# Patient Record
Sex: Male | Born: 1940
Health system: Southern US, Community
[De-identification: ages and names within clinical notes are randomized; demographics above are authoritative.]

## PROBLEM LIST (undated history)

## (undated) DIAGNOSIS — F32A Depression, unspecified: Secondary | ICD-10-CM

## (undated) DIAGNOSIS — M1990 Unspecified osteoarthritis, unspecified site: Secondary | ICD-10-CM

## (undated) DIAGNOSIS — F329 Major depressive disorder, single episode, unspecified: Secondary | ICD-10-CM

## (undated) DIAGNOSIS — C801 Malignant (primary) neoplasm, unspecified: Secondary | ICD-10-CM

## (undated) DIAGNOSIS — E119 Type 2 diabetes mellitus without complications: Secondary | ICD-10-CM

## (undated) DIAGNOSIS — K219 Gastro-esophageal reflux disease without esophagitis: Secondary | ICD-10-CM

## (undated) DIAGNOSIS — F419 Anxiety disorder, unspecified: Secondary | ICD-10-CM

## (undated) DIAGNOSIS — I82409 Acute embolism and thrombosis of unspecified deep veins of unspecified lower extremity: Secondary | ICD-10-CM

## (undated) DIAGNOSIS — N400 Enlarged prostate without lower urinary tract symptoms: Secondary | ICD-10-CM

## (undated) DIAGNOSIS — I10 Essential (primary) hypertension: Secondary | ICD-10-CM

## (undated) DIAGNOSIS — H409 Unspecified glaucoma: Secondary | ICD-10-CM

## (undated) HISTORY — PX: SHOULDER ARTHROSCOPY: SHX128

## (undated) HISTORY — PX: HIP ARTHROPLASTY: SHX981

## (undated) HISTORY — PX: EYE SURGERY: SHX253

---

## 2004-10-16 ENCOUNTER — Ambulatory Visit: Payer: Self-pay | Admitting: Internal Medicine

## 2004-11-15 ENCOUNTER — Ambulatory Visit: Payer: Self-pay | Admitting: Gastroenterology

## 2006-12-31 ENCOUNTER — Encounter: Admission: RE | Admit: 2006-12-31 | Discharge: 2006-12-31 | Payer: Self-pay | Admitting: Orthopedic Surgery

## 2008-02-25 ENCOUNTER — Encounter: Admission: RE | Admit: 2008-02-25 | Discharge: 2008-02-25 | Payer: Self-pay | Admitting: Internal Medicine

## 2010-07-05 ENCOUNTER — Encounter: Payer: Self-pay | Admitting: Cardiology

## 2010-07-15 ENCOUNTER — Encounter: Payer: Self-pay | Admitting: Cardiology

## 2010-08-08 ENCOUNTER — Encounter: Payer: Self-pay | Admitting: Cardiology

## 2010-08-09 ENCOUNTER — Encounter: Payer: Self-pay | Admitting: Cardiology

## 2010-08-21 DIAGNOSIS — F4000 Agoraphobia, unspecified: Secondary | ICD-10-CM | POA: Insufficient documentation

## 2010-08-21 DIAGNOSIS — I82409 Acute embolism and thrombosis of unspecified deep veins of unspecified lower extremity: Secondary | ICD-10-CM | POA: Insufficient documentation

## 2010-08-21 DIAGNOSIS — E7800 Pure hypercholesterolemia, unspecified: Secondary | ICD-10-CM

## 2010-08-21 DIAGNOSIS — N419 Inflammatory disease of prostate, unspecified: Secondary | ICD-10-CM | POA: Insufficient documentation

## 2010-08-21 DIAGNOSIS — H409 Unspecified glaucoma: Secondary | ICD-10-CM | POA: Insufficient documentation

## 2010-08-21 DIAGNOSIS — F409 Phobic anxiety disorder, unspecified: Secondary | ICD-10-CM | POA: Insufficient documentation

## 2010-08-21 DIAGNOSIS — E785 Hyperlipidemia, unspecified: Secondary | ICD-10-CM | POA: Insufficient documentation

## 2010-08-21 DIAGNOSIS — G4700 Insomnia, unspecified: Secondary | ICD-10-CM

## 2010-08-21 DIAGNOSIS — I10 Essential (primary) hypertension: Secondary | ICD-10-CM | POA: Insufficient documentation

## 2010-08-21 DIAGNOSIS — E291 Testicular hypofunction: Secondary | ICD-10-CM

## 2010-08-21 DIAGNOSIS — K219 Gastro-esophageal reflux disease without esophagitis: Secondary | ICD-10-CM

## 2010-08-22 ENCOUNTER — Ambulatory Visit: Payer: Self-pay | Admitting: Cardiology

## 2010-08-22 DIAGNOSIS — R0602 Shortness of breath: Secondary | ICD-10-CM

## 2010-08-22 DIAGNOSIS — R0789 Other chest pain: Secondary | ICD-10-CM

## 2010-09-06 ENCOUNTER — Telehealth: Payer: Self-pay | Admitting: Cardiology

## 2010-09-24 ENCOUNTER — Telehealth (INDEPENDENT_AMBULATORY_CARE_PROVIDER_SITE_OTHER): Payer: Self-pay | Admitting: Radiology

## 2010-09-25 ENCOUNTER — Ambulatory Visit: Payer: Self-pay | Admitting: Cardiovascular Disease

## 2010-09-25 ENCOUNTER — Encounter: Payer: Self-pay | Admitting: Cardiovascular Disease

## 2010-09-25 ENCOUNTER — Encounter (HOSPITAL_COMMUNITY)
Admission: RE | Admit: 2010-09-25 | Discharge: 2010-10-11 | Payer: Self-pay | Source: Home / Self Care | Admitting: Cardiology

## 2010-09-25 ENCOUNTER — Ambulatory Visit: Payer: Self-pay

## 2011-01-14 NOTE — Assessment & Plan Note (Signed)
Summary: np6/weakness/chest pain/jml   Visit Type:  Follow-up Primary Provider:  Dr. Waynard Saine  CC:  chest pain.  History of Present Illness: The patient presents for evaluation of palpitations weakness and presyncope. The patient has no prior cardiac history other than stress perfusion study in 2004 in [place]. This was apparently negative for any evidence of ischemia. He has had nonspecific EKG changes in the past. In late July he had an episode while in his house of "in on sensation" in his chest. It was a discomfort like cramping and tingling that radiated to his mid and upper back. He had never had this before. It lasted for several seconds but he was presyncopal with this. Following this he felt quite fatigued. He thought maybe his blood sugar was low but he didn't tested. Since that time he has had episodes of weakness though not as significant. He does not describe neck or arm discomfort. He has not had any frank syncope. He has had some vague symptoms of perhaps palpitations in his chest. He has had some occasional episodes of shortness of breath. He can garden and and do household activities without bringing on symptoms. However, he does report that he's had less exercise tolerance and stamina. He has been diagnosed with low testosterone and is considering therapy for this.  Current Medications (verified): 1)  Coumadin 5 Mg Tabs (Warfarin Sodium) .... As Directed 2)  Crestor 20 Mg Tabs (Rosuvastatin Calcium) .Marland Kitchen.. 1 Tab Qd 3)  Niaspan 500 Mg Cr-Tabs (Niacin (Antihyperlipidemic)) .Marland Kitchen.. 1 Tab Qd 4)  Aspirin 81 Mg Tbec (Aspirin) .... Take One Tablet By Mouth Daily 5)  Lisinopril 20 Mg Tabs (Lisinopril) .Marland Kitchen.. 1 Tab Two Times A Day 6)  Fenofibrate 160 Mg Tabs (Fenofibrate) .Marland Kitchen.. 1 Tab Qd 7)  Prilosec 20 Mg Cpdr (Omeprazole) .Marland Kitchen.. 1 Tab Qd 8)  Effexor Xr 150 Mg Xr24h-Cap (Venlafaxine Hcl) .Marland Kitchen.. 1 Tab Qd 9)  Anafranil 75 Mg Caps (Clomipramine Hcl) .Marland Kitchen.. 1 Tab Once Daily 10)  Trazodone Hcl 50 Mg Tabs  (Trazodone Hcl) .Marland Kitchen.. 1 Tab Qhs 11)  Vitamin D 50,000 Uits .... Weelky 12)  Travatan Z 0.004 % Soln (Travoprost) .Marland Kitchen.. 1 Drop Both Eyes Daily 13)  Betimol 0.5 % Soln (Timolol Hemihydrate) .Marland Kitchen.. 1 Drop Both Eyes Two Times A Day 14)  Alphagan P 0.15 % Soln (Brimonidine Tartrate) .Marland Kitchen.. 1 Dropboth Eyes Three Times A Day 15)  Ambien 10 Mg Tabs (Zolpidem Tartrate) .... Prn 16)  Dalmane30mg  .... As Needed-Air Planeflights 17)  Alprazolam 0.5 Mg Tabs (Alprazolam) .... As Needed -Anxiety 18)  Levitra 20 Mg Tabs (Vardenafil Hcl) .... As Needed -Ed 19)  Mult Vit .... Daily 20)  Calicium .... 500mg  Once Daily 21)  Miralax .... Twice A Week 22)  Omega 3  1200mg  .... Daily 23)  Docusate Sodium 250 Mg Caps (Docusate Sodium) .... 3 Times A Day 24)  Coq 10 300mg  .... Once Daily  Allergies (verified): 1)  ! Sulfa  Past History:  Past Medical History: HYPERTENSION x years HYPERLIPIDEMIA  UNSPECIFIED PROSTATITIS PHOBIA  INSOMNIA  HYPOGONADISM HYPERCHOLESTEROLEMIA  GLUCOMA  GERD  DEEP VENOUS THROMBOPHLEBITIS, LEG, RIGHT  AGORAPHOBIA Diabetes (diet controlled)  Past Surgical History: Reviewed history from 08/21/2010 and no changes required. Total Hip athroplasty Right shoulder  Family History: Father: MI (died age 44), 1st sign-resp problems, ASCAD Mother: DM, ASCAD, ASCVD, ASPVD, COPD  Social History: Married  Tobacco Use - Former. (Quit 1981, up to 4ppd starting age 45) Alcohol Use - yes  Review  of Systems       Positive for fatigue, lightheadedness, constipation, reflux, erectile dysfunction. Otherwise as stated in the history of present illness negative for all other systems.  Vital Signs:  Patient profile:   70 year old male Height:      67 inches Weight:      178 pounds BMI:     27.98 Pulse rate:   60 / minute BP sitting:   150 / 70  (left arm)  Vitals Entered By: Burnett Kanaris, CNA (August 22, 2010 10:08 AM)  Physical Exam  General:  Well developed, well nourished,  in no acute distress. Head:  normocephalic and atraumatic Eyes:  PERRLA/EOM intact; conjunctiva and lids normal. Mouth:  Teeth, gums and palate normal. Oral mucosa normal. Neck:  Neck supple, no JVD. No masses, thyromegaly or abnormal cervical nodes. Chest Wall:  no deformities or breast masses noted Lungs:  Clear bilaterally to auscultation and percussion. Abdomen:  Bowel sounds positive; abdomen soft and non-tender without masses, organomegaly, or hernias noted. No hepatosplenomegaly. Msk:  Back normal, normal gait. Muscle strength and tone normal. Extremities:  No clubbing or cyanosis. Neurologic:  Alert and oriented x 3. Skin:  Intact without lesions or rashes. Cervical Nodes:  no significant adenopathy Axillary Nodes:  no significant adenopathy Inguinal Nodes:  no significant adenopathy Psych:  Normal affect.   Detailed Cardiovascular Exam  Neck    Carotids: Carotids full and equal bilaterally without bruits.      Neck Veins: Normal, no JVD.    Heart    Inspection: no deformities or lifts noted.      Palpation: normal PMI with no thrills palpable.      Auscultation: regular rate and rhythm, S1, S2 without murmurs, rubs, gallops, or clicks.    Vascular    Abdominal Aorta: no palpable masses, pulsations, or audible bruits.      Femoral Pulses: normal femoral pulses bilaterally.      Pedal Pulses: normal pedal pulses bilaterally.      Radial Pulses: normal radial pulses bilaterally.      Peripheral Circulation: no clubbing, cyanosis, or edema noted with normal capillary refill.     EKG  Procedure date:  08/22/2010  Findings:      Sinus rhythm, rate 60, axis within normal limits, intervals within normal limits, no acute ST-T wave changes  Impression & Recommendations:  Problem # 1:  CHEST DISCOMFORT (ICD-786.59)  The patient had some chest discomfort as described. He does have significant cardiovascular risk factors including a strong family history. Given this the  pretest probability of obstructive coronary disease is at least moderately high. This necessitates the added sensitivity and specificity of stress perfusion imaging. The patient would be able to walk on a treadmill and will have this study arranged.  Orders: Nuclear Stress Test (Nuc Stress Test)  Problem # 2:  DYSPNEA (ICD-786.05) His dyspnea will be evaluated as above. If this persists also check a BNP level. Orders: EKG w/ Interpretation (93000) Nuclear Stress Test (Nuc Stress Test)  Problem # 3:  HYPERTENSION (ICD-401.9) His blood pressure is well-controlled. He will continue the medicines as listed.  Problem # 4:  HYPERLIPIDEMIA (ICD-272.4) the patient is on an aggressive medical regimen and this is followed closely by his primary physician.  Patient Instructions: 1)  Your physician has requested that you have a Thallium Study..  For further information please visit [email or web address]  Please follow instruction sheet, as given.

## 2011-01-14 NOTE — Assessment & Plan Note (Signed)
Summary: Cardiology Nuclear Testing  Nuclear Med Background Indications for Stress Test: Evaluation for Ischemia   History: Myocardial Perfusion Study  History Comments: '04  NML MPI / NO REPORT  Symptoms: Chest Pain, DOE, Fatigue, Fatigue with Exertion, Light-Headedness, Near Syncope, Palpitations, SOB    Nuclear Pre-Procedure Cardiac Risk Factors: Family History - CAD, History of Smoking, Hypertension, Lipids, NIDDM Caffeine/Decaff Intake: None NPO After: 7:00 AM Lungs: clear IV 0.9% NS with Angio Cath: 22g     IV Site: L Antecubital IV Started by: Irean Hong, RN Chest Size (in) 42     Height (in): 67 Weight (lb): 181 BMI: 28.45  Nuclear Med Study 1 or 2 day study:  1 day     Stress Test Type:  Treadmill/Lexiscan Reading MD:  Charlton Haws, MD     Resting Radionuclide:  Technetium 32m Tetrofosmin     Resting Radionuclide Dose:  11 mCi  Stress Radionuclide:  Technetium 36m Tetrofosmin     Stress Radionuclide Dose:  33 mCi   Stress Protocol Exercise Time (min):  6:45 min     Max HR:  137 bpm     Predicted Max HR:  153 bpm  Max Systolic BP: 216 mm Hg     Percent Max HR:  89.54 %     METS: 7.0 Rate Pressure Product:  16109  Lexiscan: 0.4 mg   Stress Test Technologist:  Milana Na, EMT-P     Nuclear Technologist:  Doyne Keel, CNMT  Rest Procedure  Myocardial perfusion imaging was performed at rest 45 minutes following the intravenous administration of Technetium 89m Tetrofosmin.  Stress Procedure  The patient received IV Lexiscan 0.4 mg over 15-seconds with concurrent low level exercise and then Technetium 73m Tetrofosmin was injected at 30-seconds while the patient continued walking one more minute.  There were + significant changes, sob, and a rare pac with Lexiscan.  Quantitative spect images were obtained after a 45 minute delay.  QPS Raw Data Images:  Normal; no motion artifact; normal heart/lung ratio. Stress Images:  Normal homogeneous uptake in all areas  of the myocardium. Rest Images:  Normal homogeneous uptake in all areas of the myocardium. Subtraction (SDS):  SDS 3 scattered Transient Ischemic Dilatation:  0.71  (Normal <1.22)  Lung/Heart Ratio:  0.27  (Normal <0.45)  Quantitative Gated Spect Images QGS EDV:  70 ml QGS ESV:  19 ml QGS EF:  73 % QGS cine images:  normal  Findings Normal nuclear study      Overall Impression  Exercise Capacity: lexiscan with exercise BP Response: Normal blood pressure response. Clinical Symptoms: Dyspnea and Pale ECG Impression: No significant ST segment change suggestive of ischemia. Overall Impression: Normal stress nuclear study.  Appended Document: Cardiology Nuclear Testing fax to dr Waynard Jeancharles No evidence of ischemia or infarct.  Appended Document: Cardiology Nuclear Testing pt aware of results

## 2011-01-14 NOTE — Progress Notes (Signed)
Summary: NUC PRE-PROCEDURE  Phone Note Outgoing Call   Call placed by: Domenic Polite, CNMT,  September 24, 2010 10:28 AM Call placed to: Patient Reason for Call: Confirm/change Appt Summary of Call: Reviewed information on Myoview Information Sheet (see scanned document for further details).  Spoke with patient.  Initial call taken by: Domenic Polite, CNMT,  September 24, 2010 10:29 AM     Nuclear Med Background Indications for Stress Test: Evaluation for Ischemia   History: Myocardial Perfusion Study  History Comments: '04  NML MPI / NO REPORT  Symptoms: Chest Pain, Fatigue with Exertion, Light-Headedness, Near Syncope, Palpitations, SOB    Nuclear Pre-Procedure Cardiac Risk Factors: Family History - CAD, History of Smoking, Hypertension, Lipids, NIDDM Height (in): 67

## 2011-01-14 NOTE — Progress Notes (Signed)
Summary: unable to reach pt to schedule  Have been unable to reach pt to schedule appt.  He has also cancelled his follow up appt with Dr Antoine Poche  ---- Converted from flag ---- ---- 08/29/2010 4:47 PM, Omar Person wrote: Elita Quick,  I have called Mr. Clouse several times no call back. Pt info is in call back.   ---- 08/22/2010 2:52 PM, Omar Person wrote: pt will calll to set up appt Omar Person  August 22, 2010 2:51 PM  ---- 08/22/2010 11:01 AM, Meredith Staggers, RN wrote: The following orders have been entered for this patient and placed on Admin Hold:  Type:     Referral       Code:   Nuc Stress Test Description:   Nuclear Stress Test Order Date:   08/22/2010   Authorized By:   Rollene Rotunda, MD, [facility] Order #:   [id number] Clinical Notes:   ___Exercise Stress ___Adenosine ___Lexiscan ___Dobutamine _X__Myocardial Viability-Thallium pt has UMR  Prioity___1(next day) ___2(one week) ___3 (prn)  ____1 Day Protocol ____2 Day Protocol ------------------------------

## 2011-01-14 NOTE — Progress Notes (Signed)
Summary: [facility] Med List  [facility] Med List   Imported By: Roderic Ovens 09/09/2010 12:04:38  _____________________________________________________________________  External Attachment:    Type:   Image     Comment:   External Document

## 2011-01-14 NOTE — Progress Notes (Signed)
Summary: [facility] Past Medical History   [facility] Past Medical History   Imported By: Roderic Ovens 10/28/2010 11:07:01  _____________________________________________________________________  External Attachment:    Type:   Image     Comment:   External Document

## 2011-01-17 NOTE — Letter (Signed)
Summary: [facility] Office Visit Note   [facility] Office Visit Note   Imported By: Roderic Ovens 09/09/2010 12:03:55  _____________________________________________________________________  External Attachment:    Type:   Image     Comment:   External Document

## 2014-08-04 ENCOUNTER — Other Ambulatory Visit (HOSPITAL_COMMUNITY): Payer: Self-pay | Admitting: Orthopedic Surgery

## 2014-08-04 DIAGNOSIS — M25562 Pain in left knee: Secondary | ICD-10-CM

## 2014-08-10 ENCOUNTER — Ambulatory Visit (HOSPITAL_COMMUNITY): Admission: RE | Admit: 2014-08-10 | Payer: 59 | Source: Ambulatory Visit

## 2014-08-10 ENCOUNTER — Ambulatory Visit (HOSPITAL_COMMUNITY)
Admission: RE | Admit: 2014-08-10 | Discharge: 2014-08-10 | Disposition: A | Discharge status: Home / Self Care | Payer: 59 | Source: Ambulatory Visit | Attending: Orthopedic Surgery | Admitting: Orthopedic Surgery

## 2014-08-10 DIAGNOSIS — M25569 Pain in unspecified knee: Secondary | ICD-10-CM | POA: Insufficient documentation

## 2014-08-10 DIAGNOSIS — M25562 Pain in left knee: Secondary | ICD-10-CM

## 2014-08-10 DIAGNOSIS — IMO0002 Reserved for concepts with insufficient information to code with codable children: Secondary | ICD-10-CM | POA: Diagnosis not present

## 2014-08-10 DIAGNOSIS — X58XXXA Exposure to other specified factors, initial encounter: Secondary | ICD-10-CM | POA: Insufficient documentation

## 2014-08-10 DIAGNOSIS — M2240 Chondromalacia patellae, unspecified knee: Secondary | ICD-10-CM | POA: Diagnosis not present

## 2015-08-02 ENCOUNTER — Other Ambulatory Visit (HOSPITAL_COMMUNITY): Payer: Self-pay | Admitting: Neurosurgery

## 2015-08-02 DIAGNOSIS — M5412 Radiculopathy, cervical region: Secondary | ICD-10-CM

## 2015-08-06 ENCOUNTER — Ambulatory Visit (HOSPITAL_COMMUNITY)
Admission: RE | Admit: 2015-08-06 | Discharge: 2015-08-06 | Disposition: A | Discharge status: Home / Self Care | Payer: 59 | Source: Ambulatory Visit | Attending: Neurosurgery | Admitting: Neurosurgery

## 2015-08-06 ENCOUNTER — Ambulatory Visit (HOSPITAL_COMMUNITY): Payer: 59

## 2015-08-06 DIAGNOSIS — M542 Cervicalgia: Secondary | ICD-10-CM | POA: Diagnosis present

## 2015-08-06 DIAGNOSIS — M5412 Radiculopathy, cervical region: Secondary | ICD-10-CM

## 2015-08-06 DIAGNOSIS — M5022 Other cervical disc displacement, mid-cervical region: Secondary | ICD-10-CM | POA: Diagnosis not present

## 2015-08-06 DIAGNOSIS — M4802 Spinal stenosis, cervical region: Secondary | ICD-10-CM | POA: Insufficient documentation

## 2015-08-14 ENCOUNTER — Other Ambulatory Visit: Payer: Self-pay | Admitting: Neurosurgery

## 2015-09-24 ENCOUNTER — Encounter (HOSPITAL_COMMUNITY): Payer: Self-pay

## 2015-09-24 ENCOUNTER — Encounter (HOSPITAL_COMMUNITY)
Admission: RE | Admit: 2015-09-24 | Discharge: 2015-09-24 | Disposition: A | Discharge status: Home / Self Care | Payer: 59 | Source: Ambulatory Visit | Attending: Neurosurgery | Admitting: Neurosurgery

## 2015-09-24 DIAGNOSIS — E119 Type 2 diabetes mellitus without complications: Secondary | ICD-10-CM | POA: Diagnosis not present

## 2015-09-24 DIAGNOSIS — M5022 Other cervical disc displacement, mid-cervical region, unspecified level: Secondary | ICD-10-CM | POA: Insufficient documentation

## 2015-09-24 DIAGNOSIS — Z7982 Long term (current) use of aspirin: Secondary | ICD-10-CM | POA: Diagnosis not present

## 2015-09-24 DIAGNOSIS — Z7901 Long term (current) use of anticoagulants: Secondary | ICD-10-CM | POA: Diagnosis not present

## 2015-09-24 DIAGNOSIS — Z86718 Personal history of other venous thrombosis and embolism: Secondary | ICD-10-CM | POA: Diagnosis not present

## 2015-09-24 DIAGNOSIS — Z79899 Other long term (current) drug therapy: Secondary | ICD-10-CM | POA: Diagnosis not present

## 2015-09-24 DIAGNOSIS — Z01818 Encounter for other preprocedural examination: Secondary | ICD-10-CM | POA: Diagnosis present

## 2015-09-24 DIAGNOSIS — I10 Essential (primary) hypertension: Secondary | ICD-10-CM | POA: Insufficient documentation

## 2015-09-24 DIAGNOSIS — K219 Gastro-esophageal reflux disease without esophagitis: Secondary | ICD-10-CM | POA: Insufficient documentation

## 2015-09-24 DIAGNOSIS — Z01812 Encounter for preprocedural laboratory examination: Secondary | ICD-10-CM | POA: Diagnosis not present

## 2015-09-24 DIAGNOSIS — H409 Unspecified glaucoma: Secondary | ICD-10-CM | POA: Insufficient documentation

## 2015-09-24 HISTORY — DX: Benign prostatic hyperplasia without lower urinary tract symptoms: N40.0

## 2015-09-24 HISTORY — DX: Anxiety disorder, unspecified: F41.9

## 2015-09-24 HISTORY — DX: Gastro-esophageal reflux disease without esophagitis: K21.9

## 2015-09-24 HISTORY — DX: Essential (primary) hypertension: I10

## 2015-09-24 HISTORY — DX: Unspecified osteoarthritis, unspecified site: M19.90

## 2015-09-24 HISTORY — DX: Acute embolism and thrombosis of unspecified deep veins of unspecified lower extremity: I82.409

## 2015-09-24 HISTORY — DX: Unspecified glaucoma: H40.9

## 2015-09-24 HISTORY — DX: Major depressive disorder, single episode, unspecified: F32.9

## 2015-09-24 HISTORY — DX: Depression, unspecified: F32.A

## 2015-09-24 HISTORY — DX: Type 2 diabetes mellitus without complications: E11.9

## 2015-09-24 HISTORY — DX: Malignant (primary) neoplasm, unspecified: C80.1

## 2015-09-24 LAB — BASIC METABOLIC PANEL
ANION GAP: 7 (ref 5–15)
BUN: 17 mg/dL (ref 6–20)
CALCIUM: 9.2 mg/dL (ref 8.9–10.3)
CHLORIDE: 104 mmol/L (ref 101–111)
CO2: 29 mmol/L (ref 22–32)
Creatinine, Ser: 1.05 mg/dL (ref 0.61–1.24)
GFR calc non Af Amer: >60 mL/min (ref >60)
GLUCOSE: 99 mg/dL (ref 65–99)
Potassium: 4 mmol/L (ref 3.5–5.1)
Sodium: 140 mmol/L (ref 135–145)

## 2015-09-24 LAB — APTT: APTT: 36 s (ref 24–37)

## 2015-09-24 LAB — CBC
HEMATOCRIT: 41.7 % (ref 39.0–52.0)
HEMOGLOBIN: 13.3 g/dL (ref 13.0–17.0)
MCH: 28.7 pg (ref 26.0–34.0)
MCHC: 31.9 g/dL (ref 30.0–36.0)
MCV: 89.9 fL (ref 78.0–100.0)
Platelets: 290 10*3/uL (ref 150–400)
RBC: 4.64 MIL/uL (ref 4.22–5.81)
RDW: 13.6 % (ref 11.5–15.5)
WBC: 8.4 10*3/uL (ref 4.0–10.5)

## 2015-09-24 LAB — SURGICAL PCR SCREEN
MRSA, PCR: NEGATIVE
STAPHYLOCOCCUS AUREUS: NEGATIVE

## 2015-09-24 LAB — PROTIME-INR
INR: 2.38 — AB (ref 0.00–1.49)
Prothrombin Time: 25.7 seconds — ABNORMAL HIGH (ref 11.6–15.2)

## 2015-09-24 LAB — GLUCOSE, CAPILLARY: GLUCOSE-CAPILLARY: 102 mg/dL — AB (ref 65–99)

## 2015-09-24 NOTE — Pre-Procedure Instructions (Signed)
    Koren Shiver  09/24/2015     No Pharmacies Listed   Your procedure is scheduled on 10/04/15.  Report to [facility] Admitting at 7 A.M.  Call this number if you have problems the morning of surgery:  [phone]   Remember:  Do not eat food or drink liquids after midnight.  Take these medicines the morning of surgery with A SIP OF WATER-   Do not wear jewelry, make-up or nail polish.  Do not wear lotions, powders, or perfumes.  You may wear deodorant.  Do not shave 48 hours prior to surgery.  Men may shave face and neck.  Do not bring valuables to the hospital.  [facility] is not responsible for any belongings or valuables.  Contacts, dentures or bridgework may not be worn into surgery.  Leave your suitcase in the car.  After surgery it may be brought to your room.  For patients admitted to the hospital, discharge time will be determined by your treatment team.  Patients discharged the day of surgery will not be allowed to drive home.   Name and phone number of your driver:    Special instructions:    Please read over the following fact sheets that you were given. Pain Booklet, Coughing and Deep Breathing, MRSA Information and Surgical Site Infection Prevention

## 2015-09-25 ENCOUNTER — Encounter (HOSPITAL_COMMUNITY): Payer: Self-pay

## 2015-09-25 NOTE — Progress Notes (Signed)
Anesthesia Chart Review:  Pt is 74 year old male scheduled for C4-5, C5-6, C6-7 ACDF on 10/04/2015 with Dr. Vertell Limber.   PCP is Dr. Crist Infante.   PMH includes: HTN, DVT, DM, glaucoma, GERD. Smoking status not addressed. BMI 27.5.   Medications include: amlodipine, ASA, fenofibrate, lisinopril, prilosec, crestor, cialis, timolol, coumadin. Pt to stop coumadin 09/29/15.   Preoperative labs reviewed.  PT 25.7. PTT WNL. Will repeat PT DOS.   EKG 09/24/2015: NSR.   Pt has medical clearance from Dr. Joylene Draft on paper chart.   If PT acceptable DOS, I anticipate pt can proceed as scheduled.   Willeen Cass, FNP-BC [facility]/Anesthesiology Phone: [phone] 09/25/2015 2:59 PM

## 2015-10-04 ENCOUNTER — Ambulatory Visit (HOSPITAL_COMMUNITY): Payer: 59 | Admitting: Emergency Medicine

## 2015-10-04 ENCOUNTER — Ambulatory Visit (HOSPITAL_COMMUNITY): Payer: 59 | Admitting: Certified Registered Nurse Anesthetist

## 2015-10-04 ENCOUNTER — Inpatient Hospital Stay (HOSPITAL_COMMUNITY)
Admission: RE | Admit: 2015-10-04 | Discharge: 2015-10-05 | DRG: 473 | Disposition: A | Discharge status: Home / Self Care | Payer: 59 | Source: Ambulatory Visit | Attending: Neurosurgery | Admitting: Neurosurgery

## 2015-10-04 ENCOUNTER — Encounter (HOSPITAL_COMMUNITY)
Admission: RE | Disposition: A | Discharge status: Home / Self Care | Payer: Self-pay | Source: Ambulatory Visit | Attending: Neurosurgery

## 2015-10-04 ENCOUNTER — Encounter (HOSPITAL_COMMUNITY): Payer: Self-pay | Admitting: *Deleted

## 2015-10-04 ENCOUNTER — Ambulatory Visit (HOSPITAL_COMMUNITY): Payer: 59

## 2015-10-04 DIAGNOSIS — E119 Type 2 diabetes mellitus without complications: Secondary | ICD-10-CM | POA: Diagnosis present

## 2015-10-04 DIAGNOSIS — M50122 Cervical disc disorder at C5-C6 level with radiculopathy: Secondary | ICD-10-CM | POA: Diagnosis present

## 2015-10-04 DIAGNOSIS — M5020 Other cervical disc displacement, unspecified cervical region: Secondary | ICD-10-CM | POA: Diagnosis present

## 2015-10-04 DIAGNOSIS — M4802 Spinal stenosis, cervical region: Secondary | ICD-10-CM | POA: Diagnosis present

## 2015-10-04 DIAGNOSIS — M50123 Cervical disc disorder at C6-C7 level with radiculopathy: Secondary | ICD-10-CM | POA: Diagnosis present

## 2015-10-04 DIAGNOSIS — K219 Gastro-esophageal reflux disease without esophagitis: Secondary | ICD-10-CM | POA: Diagnosis present

## 2015-10-04 DIAGNOSIS — M50121 Cervical disc disorder at C4-C5 level with radiculopathy: Principal | ICD-10-CM | POA: Diagnosis present

## 2015-10-04 DIAGNOSIS — Z87891 Personal history of nicotine dependence: Secondary | ICD-10-CM

## 2015-10-04 DIAGNOSIS — I10 Essential (primary) hypertension: Secondary | ICD-10-CM | POA: Diagnosis present

## 2015-10-04 DIAGNOSIS — Z86718 Personal history of other venous thrombosis and embolism: Secondary | ICD-10-CM | POA: Diagnosis not present

## 2015-10-04 DIAGNOSIS — F418 Other specified anxiety disorders: Secondary | ICD-10-CM | POA: Diagnosis present

## 2015-10-04 DIAGNOSIS — Z7901 Long term (current) use of anticoagulants: Secondary | ICD-10-CM | POA: Diagnosis not present

## 2015-10-04 DIAGNOSIS — M4722 Other spondylosis with radiculopathy, cervical region: Secondary | ICD-10-CM | POA: Diagnosis present

## 2015-10-04 DIAGNOSIS — Z419 Encounter for procedure for purposes other than remedying health state, unspecified: Secondary | ICD-10-CM

## 2015-10-04 DIAGNOSIS — M2578 Osteophyte, vertebrae: Secondary | ICD-10-CM | POA: Diagnosis present

## 2015-10-04 HISTORY — PX: ANTERIOR CERVICAL DECOMP/DISCECTOMY FUSION: SHX1161

## 2015-10-04 LAB — APTT: aPTT: 27 seconds (ref 24–37)

## 2015-10-04 LAB — GLUCOSE, CAPILLARY
GLUCOSE-CAPILLARY: 184 mg/dL — AB (ref 65–99)
GLUCOSE-CAPILLARY: 122 mg/dL — AB (ref 65–99)
Glucose-Capillary: 124 mg/dL — ABNORMAL HIGH (ref 65–99)
Glucose-Capillary: 128 mg/dL — ABNORMAL HIGH (ref 65–99)

## 2015-10-04 LAB — PROTIME-INR
INR: 1.12 (ref 0.00–1.49)
Prothrombin Time: 14.6 seconds (ref 11.6–15.2)

## 2015-10-04 SURGERY — ANTERIOR CERVICAL DECOMPRESSION/DISCECTOMY FUSION 3 LEVELS
Anesthesia: General | Site: Neck

## 2015-10-04 MED ORDER — ALUM & MAG HYDROXIDE-SIMETH 200-200-20 MG/5ML PO SUSP: 30.0000 mL | Freq: Four times a day (QID) | ORAL | Status: DC | PRN

## 2015-10-04 MED ORDER — PROPOFOL 10 MG/ML IV BOLUS
INTRAVENOUS | Status: DC | PRN
  Administered 2015-10-04: 200 mg via INTRAVENOUS

## 2015-10-04 MED ORDER — METHOCARBAMOL 500 MG PO TABS
500.0000 mg | ORAL_TABLET | Freq: Four times a day (QID) | ORAL | Status: DC | PRN
  Administered 2015-10-04 – 2015-10-05 (×2): 500 mg via ORAL
  Filled 2015-10-04: qty 1

## 2015-10-04 MED ORDER — HYDROMORPHONE HCL 1 MG/ML IJ SOLN
INTRAMUSCULAR | Status: AC
  Administered 2015-10-04: 0.5 mg via INTRAVENOUS
  Filled 2015-10-04: qty 1

## 2015-10-04 MED ORDER — ROCURONIUM BROMIDE 50 MG/5ML IV SOLN
INTRAVENOUS | Status: AC
  Filled 2015-10-04: qty 1

## 2015-10-04 MED ORDER — SUCCINYLCHOLINE CHLORIDE 20 MG/ML IJ SOLN
INTRAMUSCULAR | Status: AC
  Filled 2015-10-04: qty 1

## 2015-10-04 MED ORDER — THROMBIN 5000 UNITS EX SOLR
OROMUCOSAL | Status: DC | PRN
  Administered 2015-10-04: 12:00:00 via TOPICAL

## 2015-10-04 MED ORDER — ROCURONIUM BROMIDE 100 MG/10ML IV SOLN
INTRAVENOUS | Status: DC | PRN
  Administered 2015-10-04: 20 mg via INTRAVENOUS
  Administered 2015-10-04: 10 mg via INTRAVENOUS
  Administered 2015-10-04: 35 mg via INTRAVENOUS
  Administered 2015-10-04: 10 mg via INTRAVENOUS
  Administered 2015-10-04: 15 mg via INTRAVENOUS
  Administered 2015-10-04: 10 mg via INTRAVENOUS

## 2015-10-04 MED ORDER — TRAZODONE HCL 50 MG PO TABS
50.0000 mg | ORAL_TABLET | Freq: Every day | ORAL | Status: DC
  Filled 2015-10-04: qty 1

## 2015-10-04 MED ORDER — LATANOPROST 0.005 % OP SOLN
1.0000 [drp] | Freq: Every day | OPHTHALMIC | Status: DC
  Filled 2015-10-04: qty 2.5

## 2015-10-04 MED ORDER — OMEGA-3-ACID ETHYL ESTERS 1 G PO CAPS
1.0000 g | ORAL_CAPSULE | Freq: Every day | ORAL | Status: DC
  Filled 2015-10-04: qty 1

## 2015-10-04 MED ORDER — VENLAFAXINE HCL ER 150 MG PO CP24
150.0000 mg | ORAL_CAPSULE | Freq: Every day | ORAL | Status: DC
  Filled 2015-10-04 (×2): qty 1

## 2015-10-04 MED ORDER — HYDROCODONE-ACETAMINOPHEN 5-325 MG PO TABS: 1.0000 | ORAL_TABLET | ORAL | Status: DC | PRN

## 2015-10-04 MED ORDER — AMLODIPINE BESYLATE 5 MG PO TABS
5.0000 mg | ORAL_TABLET | Freq: Every day | ORAL | Status: DC
  Filled 2015-10-04 (×2): qty 1

## 2015-10-04 MED ORDER — TADALAFIL 5 MG PO TABS
5.0000 mg | ORAL_TABLET | Freq: Every day | ORAL | Status: DC
  Filled 2015-10-04 (×2): qty 1

## 2015-10-04 MED ORDER — OMEGA 3 1200 MG PO CAPS: 1.0000 | ORAL_CAPSULE | Freq: Every day | ORAL | Status: DC

## 2015-10-04 MED ORDER — METHOCARBAMOL 1000 MG/10ML IJ SOLN: 500.0000 mg | Freq: Four times a day (QID) | INTRAVENOUS | Status: DC | PRN

## 2015-10-04 MED ORDER — FLEET ENEMA 7-19 GM/118ML RE ENEM: 1.0000 | ENEMA | Freq: Once | RECTAL | Status: DC | PRN

## 2015-10-04 MED ORDER — BRIMONIDINE TARTRATE 0.2 % OP SOLN
1.0000 [drp] | Freq: Three times a day (TID) | OPHTHALMIC | Status: DC
  Filled 2015-10-04: qty 5

## 2015-10-04 MED ORDER — ROSUVASTATIN CALCIUM 20 MG PO TABS
20.0000 mg | ORAL_TABLET | Freq: Every day | ORAL | Status: DC
Start: 2015-10-04 — End: 2015-10-05

## 2015-10-04 MED ORDER — PHENYLEPHRINE 40 MCG/ML (10ML) SYRINGE FOR IV PUSH (FOR BLOOD PRESSURE SUPPORT)
PREFILLED_SYRINGE | INTRAVENOUS | Status: AC
  Filled 2015-10-04: qty 10

## 2015-10-04 MED ORDER — BRIMONIDINE TARTRATE 0.15 % OP SOLN
1.0000 [drp] | Freq: Three times a day (TID) | OPHTHALMIC | Status: DC
  Filled 2015-10-04: qty 5

## 2015-10-04 MED ORDER — ADULT MULTIVITAMIN W/MINERALS CH: 1.0000 | ORAL_TABLET | Freq: Every day | ORAL | Status: DC

## 2015-10-04 MED ORDER — SENNOSIDES-DOCUSATE SODIUM 8.6-50 MG PO TABS: 1.0000 | ORAL_TABLET | Freq: Every evening | ORAL | Status: DC | PRN

## 2015-10-04 MED ORDER — LIDOCAINE HCL (CARDIAC) 20 MG/ML IV SOLN
INTRAVENOUS | Status: DC | PRN
  Administered 2015-10-04: 60 mg via INTRAVENOUS

## 2015-10-04 MED ORDER — HYDROMORPHONE HCL 1 MG/ML IJ SOLN: 0.5000 mg | INTRAMUSCULAR | Status: DC | PRN

## 2015-10-04 MED ORDER — CEFAZOLIN SODIUM 1-5 GM-% IV SOLN
1.0000 g | Freq: Three times a day (TID) | INTRAVENOUS | Status: AC
  Administered 2015-10-04 – 2015-10-05 (×2): 1 g via INTRAVENOUS
  Filled 2015-10-04 (×2): qty 50

## 2015-10-04 MED ORDER — LACTATED RINGERS IV SOLN
INTRAVENOUS | Status: DC | PRN
  Administered 2015-10-04 (×2): via INTRAVENOUS

## 2015-10-04 MED ORDER — SUGAMMADEX SODIUM 200 MG/2ML IV SOLN
INTRAVENOUS | Status: DC | PRN
  Administered 2015-10-04: 200 mg via INTRAVENOUS

## 2015-10-04 MED ORDER — LISINOPRIL 20 MG PO TABS: 40.0000 mg | ORAL_TABLET | Freq: Every day | ORAL | Status: DC

## 2015-10-04 MED ORDER — CALCIUM CARBONATE 1250 (500 CA) MG PO TABS
1.0000 | ORAL_TABLET | Freq: Every day | ORAL | Status: DC
  Filled 2015-10-04 (×2): qty 1

## 2015-10-04 MED ORDER — TIMOLOL MALEATE 0.5 % OP SOLN
1.0000 [drp] | Freq: Two times a day (BID) | OPHTHALMIC | Status: DC
  Filled 2015-10-04: qty 5

## 2015-10-04 MED ORDER — ONDANSETRON HCL 4 MG/2ML IJ SOLN
INTRAMUSCULAR | Status: AC
  Filled 2015-10-04: qty 2

## 2015-10-04 MED ORDER — FENTANYL CITRATE (PF) 250 MCG/5ML IJ SOLN
INTRAMUSCULAR | Status: AC
  Filled 2015-10-04: qty 5

## 2015-10-04 MED ORDER — ONDANSETRON HCL 4 MG/2ML IJ SOLN: 4.0000 mg | INTRAMUSCULAR | Status: DC | PRN

## 2015-10-04 MED ORDER — TEMAZEPAM 15 MG PO CAPS: 15.0000 mg | ORAL_CAPSULE | Freq: Every evening | ORAL | Status: DC | PRN

## 2015-10-04 MED ORDER — SODIUM CHLORIDE 0.9 % IV SOLN
10.0000 mg | INTRAVENOUS | Status: DC | PRN
  Administered 2015-10-04: 20 ug/min via INTRAVENOUS

## 2015-10-04 MED ORDER — ZOLPIDEM TARTRATE 5 MG PO TABS
5.0000 mg | ORAL_TABLET | Freq: Every evening | ORAL | Status: DC | PRN
Start: 2015-10-04 — End: 2015-10-05

## 2015-10-04 MED ORDER — LACTATED RINGERS IV SOLN
INTRAVENOUS | Status: DC
  Administered 2015-10-04: 08:00:00 via INTRAVENOUS

## 2015-10-04 MED ORDER — FENOFIBRATE 160 MG PO TABS
160.0000 mg | ORAL_TABLET | Freq: Every day | ORAL | Status: DC
  Filled 2015-10-04 (×2): qty 1

## 2015-10-04 MED ORDER — BUPIVACAINE HCL (PF) 0.5 % IJ SOLN
INTRAMUSCULAR | Status: DC | PRN
  Administered 2015-10-04: 3 mL

## 2015-10-04 MED ORDER — OXYCODONE-ACETAMINOPHEN 5-325 MG PO TABS
1.0000 | ORAL_TABLET | ORAL | Status: DC | PRN
  Administered 2015-10-04 – 2015-10-05 (×3): 2 via ORAL
  Filled 2015-10-04 (×2): qty 2

## 2015-10-04 MED ORDER — SODIUM CHLORIDE 0.9 % IJ SOLN: 3.0000 mL | INTRAMUSCULAR | Status: DC | PRN

## 2015-10-04 MED ORDER — ASPIRIN EC 81 MG PO TBEC: 81.0000 mg | DELAYED_RELEASE_TABLET | Freq: Every day | ORAL | Status: DC

## 2015-10-04 MED ORDER — CLOMIPRAMINE HCL 25 MG PO CAPS
75.0000 mg | ORAL_CAPSULE | Freq: Every day | ORAL | Status: DC
  Filled 2015-10-04 (×2): qty 3

## 2015-10-04 MED ORDER — DEXAMETHASONE SODIUM PHOSPHATE 4 MG/ML IJ SOLN
INTRAMUSCULAR | Status: DC | PRN
  Administered 2015-10-04: 10 mg via INTRAVENOUS

## 2015-10-04 MED ORDER — ONDANSETRON HCL 4 MG/2ML IJ SOLN: 4.0000 mg | Freq: Once | INTRAMUSCULAR | Status: DC | PRN

## 2015-10-04 MED ORDER — THROMBIN 20000 UNITS EX SOLR
CUTANEOUS | Status: DC | PRN
  Administered 2015-10-04: 12:00:00 via TOPICAL

## 2015-10-04 MED ORDER — LIDOCAINE HCL 4 % MT SOLN
OROMUCOSAL | Status: DC | PRN
  Administered 2015-10-04: 4 mL via TOPICAL

## 2015-10-04 MED ORDER — DEXAMETHASONE SODIUM PHOSPHATE 4 MG/ML IJ SOLN
INTRAMUSCULAR | Status: AC
  Filled 2015-10-04: qty 3

## 2015-10-04 MED ORDER — ACETAMINOPHEN 325 MG PO TABS: 650.0000 mg | ORAL_TABLET | ORAL | Status: DC | PRN

## 2015-10-04 MED ORDER — PROPOFOL 10 MG/ML IV BOLUS
INTRAVENOUS | Status: AC
  Filled 2015-10-04: qty 20

## 2015-10-04 MED ORDER — ZOLPIDEM TARTRATE 5 MG PO TABS: 10.0000 mg | ORAL_TABLET | Freq: Every evening | ORAL | Status: DC | PRN

## 2015-10-04 MED ORDER — MIDAZOLAM HCL 5 MG/5ML IJ SOLN
INTRAMUSCULAR | Status: DC | PRN
  Administered 2015-10-04: 2 mg via INTRAVENOUS

## 2015-10-04 MED ORDER — SODIUM CHLORIDE 0.9 % IJ SOLN
INTRAMUSCULAR | Status: AC
  Filled 2015-10-04: qty 10

## 2015-10-04 MED ORDER — EPHEDRINE SULFATE 50 MG/ML IJ SOLN
INTRAMUSCULAR | Status: AC
  Filled 2015-10-04: qty 1

## 2015-10-04 MED ORDER — CEFAZOLIN SODIUM-DEXTROSE 2-3 GM-% IV SOLR
2.0000 g | INTRAVENOUS | Status: AC
  Administered 2015-10-04: 2 g via INTRAVENOUS
  Filled 2015-10-04: qty 50

## 2015-10-04 MED ORDER — VITAMIN D (ERGOCALCIFEROL) 1.25 MG (50000 UNIT) PO CAPS: 50000.0000 [IU] | ORAL_CAPSULE | ORAL | Status: DC

## 2015-10-04 MED ORDER — TRAVOPROST 0.004 % OP SOLN: 1.0000 [drp] | Freq: Every day | OPHTHALMIC | Status: DC

## 2015-10-04 MED ORDER — ONDANSETRON HCL 4 MG/2ML IJ SOLN
INTRAMUSCULAR | Status: DC | PRN
  Administered 2015-10-04: 4 mg via INTRAVENOUS

## 2015-10-04 MED ORDER — BISACODYL 10 MG RE SUPP: 10.0000 mg | Freq: Every day | RECTAL | Status: DC | PRN

## 2015-10-04 MED ORDER — FENTANYL CITRATE (PF) 100 MCG/2ML IJ SOLN
INTRAMUSCULAR | Status: DC | PRN
  Administered 2015-10-04: 100 ug via INTRAVENOUS

## 2015-10-04 MED ORDER — HYDROMORPHONE HCL 1 MG/ML IJ SOLN
0.2500 mg | INTRAMUSCULAR | Status: DC | PRN
  Administered 2015-10-04 (×3): 0.5 mg via INTRAVENOUS

## 2015-10-04 MED ORDER — COENZYME Q10 300 MG PO CAPS: 1.0000 | ORAL_CAPSULE | Freq: Every day | ORAL | Status: DC

## 2015-10-04 MED ORDER — SODIUM CHLORIDE 0.9 % IJ SOLN
3.0000 mL | Freq: Two times a day (BID) | INTRAMUSCULAR | Status: DC
  Administered 2015-10-04: 3 mL via INTRAVENOUS

## 2015-10-04 MED ORDER — TIMOLOL HEMIHYDRATE 0.5 % OP SOLN: 1.0000 [drp] | Freq: Two times a day (BID) | OPHTHALMIC | Status: DC

## 2015-10-04 MED ORDER — DOCUSATE SODIUM 100 MG PO CAPS: 100.0000 mg | ORAL_CAPSULE | Freq: Two times a day (BID) | ORAL | Status: DC

## 2015-10-04 MED ORDER — KCL IN DEXTROSE-NACL 20-5-0.45 MEQ/L-%-% IV SOLN
INTRAVENOUS | Status: AC
  Filled 2015-10-04: qty 1000

## 2015-10-04 MED ORDER — OYSTER CALCIUM 500 MG PO TABS: 500.0000 mg | ORAL_TABLET | Freq: Every day | ORAL | Status: DC

## 2015-10-04 MED ORDER — PANTOPRAZOLE SODIUM 40 MG PO TBEC: 40.0000 mg | DELAYED_RELEASE_TABLET | Freq: Every day | ORAL | Status: DC

## 2015-10-04 MED ORDER — PHENOL 1.4 % MT LIQD: 1.0000 | OROMUCOSAL | Status: DC | PRN

## 2015-10-04 MED ORDER — LIDOCAINE-EPINEPHRINE 1 %-1:100000 IJ SOLN
INTRAMUSCULAR | Status: DC | PRN
  Administered 2015-10-04: 3 mL

## 2015-10-04 MED ORDER — MIDAZOLAM HCL 2 MG/2ML IJ SOLN
INTRAMUSCULAR | Status: AC
  Filled 2015-10-04: qty 4

## 2015-10-04 MED ORDER — ALPRAZOLAM 0.5 MG PO TABS: 0.5000 mg | ORAL_TABLET | Freq: Two times a day (BID) | ORAL | Status: DC | PRN

## 2015-10-04 MED ORDER — ACETAMINOPHEN 650 MG RE SUPP: 650.0000 mg | RECTAL | Status: DC | PRN

## 2015-10-04 MED ORDER — PANTOPRAZOLE SODIUM 40 MG IV SOLR
40.0000 mg | Freq: Every day | INTRAVENOUS | Status: DC
  Administered 2015-10-04: 40 mg via INTRAVENOUS
  Filled 2015-10-04: qty 40

## 2015-10-04 MED ORDER — SODIUM CHLORIDE 0.9 % IV SOLN: 250.0000 mL | INTRAVENOUS | Status: DC

## 2015-10-04 MED ORDER — MULTI-VITAMIN/MINERALS PO TABS: 1.0000 | ORAL_TABLET | Freq: Every day | ORAL | Status: DC

## 2015-10-04 MED ORDER — LIDOCAINE HCL (CARDIAC) 20 MG/ML IV SOLN
INTRAVENOUS | Status: AC
  Filled 2015-10-04: qty 5

## 2015-10-04 MED ORDER — SUGAMMADEX SODIUM 200 MG/2ML IV SOLN
INTRAVENOUS | Status: AC
  Filled 2015-10-04: qty 2

## 2015-10-04 MED ORDER — KCL IN DEXTROSE-NACL 20-5-0.45 MEQ/L-%-% IV SOLN
INTRAVENOUS | Status: DC
  Administered 2015-10-04: 75 mL/h via INTRAVENOUS
  Filled 2015-10-04 (×3): qty 1000

## 2015-10-04 MED ORDER — 0.9 % SODIUM CHLORIDE (POUR BTL) OPTIME
TOPICAL | Status: DC | PRN
  Administered 2015-10-04: 1000 mL

## 2015-10-04 MED ORDER — MENTHOL 3 MG MT LOZG
1.0000 | LOZENGE | OROMUCOSAL | Status: DC | PRN
  Filled 2015-10-04: qty 9

## 2015-10-04 SURGICAL SUPPLY — 73 items
BENZOIN TINCTURE PRP APPL 2/3 (GAUZE/BANDAGES/DRESSINGS) IMPLANT
BIT DRILL NEURO 2X3.1 SFT TUCH (MISCELLANEOUS) ×1 IMPLANT
BIT DRILL POWER (BIT) ×1 IMPLANT
BLADE ULTRA TIP 2M (BLADE) ×3 IMPLANT
BNDG GAUZE ELAST 4 BULKY (GAUZE/BANDAGES/DRESSINGS) ×6 IMPLANT
BUR BARREL STRAIGHT FLUTE 4.0 (BURR) ×3 IMPLANT
CAGE SMALL 7X13X15 (Cage) ×9 IMPLANT
CANISTER SUCT 3000ML PPV (MISCELLANEOUS) ×3 IMPLANT
CLOSURE WOUND 1/2 X4 (GAUZE/BANDAGES/DRESSINGS)
COVER MAYO STAND STRL (DRAPES) ×3 IMPLANT
DECANTER SPIKE VIAL GLASS SM (MISCELLANEOUS) ×3 IMPLANT
DERMABOND ADHESIVE PROPEN (GAUZE/BANDAGES/DRESSINGS) ×2
DERMABOND ADVANCED (GAUZE/BANDAGES/DRESSINGS) ×2
DERMABOND ADVANCED .7 DNX12 (GAUZE/BANDAGES/DRESSINGS) ×1 IMPLANT
DERMABOND ADVANCED .7 DNX6 (GAUZE/BANDAGES/DRESSINGS) ×1 IMPLANT
DRAIN JACKSON PRATT 10MM FLAT (MISCELLANEOUS) ×3 IMPLANT
DRAPE LAPAROTOMY 100X72 PEDS (DRAPES) ×3 IMPLANT
DRAPE MICROSCOPE LEICA (MISCELLANEOUS) ×3 IMPLANT
DRAPE POUCH INSTRU U-SHP 10X18 (DRAPES) ×3 IMPLANT
DRAPE PROXIMA HALF (DRAPES) IMPLANT
DRILL BIT POWER (BIT) ×2
DRILL NEURO 2X3.1 SOFT TOUCH (MISCELLANEOUS) ×3
DURAPREP 6ML APPLICATOR 50/CS (WOUND CARE) ×3 IMPLANT
ELECT COATED BLADE 2.86 ST (ELECTRODE) ×3 IMPLANT
ELECT REM PT RETURN 9FT ADLT (ELECTROSURGICAL) ×3
ELECTRODE REM PT RTRN 9FT ADLT (ELECTROSURGICAL) ×1 IMPLANT
EVACUATOR SILICONE 100CC (DRAIN) ×3 IMPLANT
GAUZE SPONGE 4X4 12PLY STRL (GAUZE/BANDAGES/DRESSINGS) IMPLANT
GAUZE SPONGE 4X4 16PLY XRAY LF (GAUZE/BANDAGES/DRESSINGS) IMPLANT
GLOVE BIO SURGEON STRL SZ8 (GLOVE) ×3 IMPLANT
GLOVE BIOGEL PI IND STRL 7.5 (GLOVE) ×3 IMPLANT
GLOVE BIOGEL PI IND STRL 8 (GLOVE) ×1 IMPLANT
GLOVE BIOGEL PI IND STRL 8.5 (GLOVE) ×1 IMPLANT
GLOVE BIOGEL PI INDICATOR 7.5 (GLOVE) ×6
GLOVE BIOGEL PI INDICATOR 8 (GLOVE) ×2
GLOVE BIOGEL PI INDICATOR 8.5 (GLOVE) ×2
GLOVE ECLIPSE 8.0 STRL XLNG CF (GLOVE) ×3 IMPLANT
GLOVE EXAM NITRILE LRG STRL (GLOVE) IMPLANT
GLOVE EXAM NITRILE MD LF STRL (GLOVE) IMPLANT
GLOVE EXAM NITRILE XL STR (GLOVE) IMPLANT
GLOVE EXAM NITRILE XS STR PU (GLOVE) IMPLANT
GLOVE SURG SS PI 7.0 STRL IVOR (GLOVE) ×9 IMPLANT
GOWN STRL REUS W/ TWL LRG LVL3 (GOWN DISPOSABLE) IMPLANT
GOWN STRL REUS W/ TWL XL LVL3 (GOWN DISPOSABLE) ×3 IMPLANT
GOWN STRL REUS W/TWL 2XL LVL3 (GOWN DISPOSABLE) ×3 IMPLANT
GOWN STRL REUS W/TWL LRG LVL3 (GOWN DISPOSABLE)
GOWN STRL REUS W/TWL XL LVL3 (GOWN DISPOSABLE) ×6
HALTER HD/CHIN CERV TRACTION D (MISCELLANEOUS) ×3 IMPLANT
HEMOSTAT POWDER KIT SURGIFOAM (HEMOSTASIS) ×3 IMPLANT
KIT BASIN OR (CUSTOM PROCEDURE TRAY) ×3 IMPLANT
KIT ROOM TURNOVER OR (KITS) ×3 IMPLANT
NEEDLE HYPO 25X1 1.5 SAFETY (NEEDLE) ×3 IMPLANT
NEEDLE SPNL 18GX3.5 QUINCKE PK (NEEDLE) ×3 IMPLANT
NEEDLE SPNL 22GX3.5 QUINCKE BK (NEEDLE) ×3 IMPLANT
NS IRRIG 1000ML POUR BTL (IV SOLUTION) ×3 IMPLANT
PACK LAMINECTOMY NEURO (CUSTOM PROCEDURE TRAY) ×3 IMPLANT
PAD ARMBOARD 7.5X6 YLW CONV (MISCELLANEOUS) ×9 IMPLANT
PIN DISTRACTION 14MM (PIN) ×6 IMPLANT
PLATE ARCHON 3-LEVEL 54MM (Plate) ×3 IMPLANT
PUTTY STIMUBLAST 2.5CC (Putty) ×3 IMPLANT
RUBBERBAND STERILE (MISCELLANEOUS) ×6 IMPLANT
SCREW ARCHON SELFTAP 4.0X13 (Screw) ×24 IMPLANT
SPONGE INTESTINAL PEANUT (DISPOSABLE) ×3 IMPLANT
SPONGE SURGIFOAM ABS GEL 100 (HEMOSTASIS) ×3 IMPLANT
STAPLER SKIN PROX WIDE 3.9 (STAPLE) IMPLANT
STRIP CLOSURE SKIN 1/2X4 (GAUZE/BANDAGES/DRESSINGS) IMPLANT
SUT VIC AB 3-0 SH 8-18 (SUTURE) ×6 IMPLANT
SYR 5ML LL (SYRINGE) IMPLANT
TOWEL OR 17X24 6PK STRL BLUE (TOWEL DISPOSABLE) ×3 IMPLANT
TOWEL OR 17X26 10 PK STRL BLUE (TOWEL DISPOSABLE) ×3 IMPLANT
TRAP SPECIMEN MUCOUS 40CC (MISCELLANEOUS) ×3 IMPLANT
TRAY FOLEY W/METER SILVER 14FR (SET/KITS/TRAYS/PACK) IMPLANT
WATER STERILE IRR 1000ML POUR (IV SOLUTION) ×3 IMPLANT

## 2015-10-04 NOTE — Anesthesia Postprocedure Evaluation (Signed)
  Anesthesia Post-op Note  Patient: Brian Owens  Procedure(s) Performed: Procedure(s) with comments: Cervical four-five, Cervical five-six, Cervical six-seven Anterior cervical decompression/diskectomy/fusion (N/A) - C4-5 C5-6 C6-7 Anterior cervical decompression/diskectomy/fusion  Patient Location: PACU  Anesthesia Type:General  Level of Consciousness: awake, alert , oriented and patient cooperative  Airway and Oxygen Therapy: Patient Spontanous Breathing  Post-op Pain: mild  Post-op Assessment: Post-op Vital signs reviewed, Patient's Cardiovascular Status Stable, Respiratory Function Stable, Patent Airway, No signs of Nausea or vomiting and Pain level controlled              Post-op Vital Signs: stable  Last Vitals:  Filed Vitals:   10/04/15 1310  BP: 118/59  Pulse: 76  Temp:   Resp: 32    Complications: No apparent anesthesia complications

## 2015-10-04 NOTE — Interval H&P Note (Signed)
History and Physical Interval Note:  10/04/2015 10:08 AM  Brian Owens  has presented today for surgery, with the diagnosis of Displacement of intervertebral disc of mid cervical region  The various methods of treatment have been discussed with the patient and family. After consideration of risks, benefits and other options for treatment, the patient has consented to  Procedure(s) with comments: C4-5 C5-6 C6-7 Anterior cervical decompression/diskectomy/fusion (N/A) - C4-5 C5-6 C6-7 Anterior cervical decompression/diskectomy/fusion as a surgical intervention .  The patient's history has been reviewed, patient examined, no change in status, stable for surgery.  I have reviewed the patient's chart and labs.  Questions were answered to the patient's satisfaction.     Lashya Passe D

## 2015-10-04 NOTE — Anesthesia Preprocedure Evaluation (Addendum)
Anesthesia Evaluation  Patient identified by MRN, date of birth, ID band Patient awake    Reviewed: Allergy & Precautions, NPO status , Patient's Chart, lab work & pertinent test results  Airway Mallampati: II  TM Distance: >3 FB Neck ROM: Full    Dental  (+) Teeth Intact, Implants, Dental Advisory Given   Pulmonary shortness of breath and with exertion, former smoker,    Pulmonary exam normal        Cardiovascular hypertension, Pt. on medications + Peripheral Vascular Disease  Normal cardiovascular exam     Neuro/Psych Anxiety Depression    GI/Hepatic GERD  Medicated and Controlled,  Endo/Other  diabetes, Well Controlled, Type 2  Renal/GU      Musculoskeletal  (+) Arthritis , Osteoarthritis,    Abdominal   Peds  Hematology   Anesthesia Other Findings   Reproductive/Obstetrics                            Anesthesia Physical Anesthesia Plan  ASA: II  Anesthesia Plan: General   Post-op Pain Management:    Induction: Intravenous  Airway Management Planned: Oral ETT  Additional Equipment:   Intra-op Plan:   Post-operative Plan: Extubation in OR  Informed Consent: I have reviewed the patients History and Physical, chart, labs and discussed the procedure including the risks, benefits and alternatives for the proposed anesthesia with the patient or authorized representative who has indicated his/her understanding and acceptance.   Dental advisory given  Plan Discussed with: CRNA, Anesthesiologist and Surgeon  Anesthesia Plan Comments:        Anesthesia Quick Evaluation

## 2015-10-04 NOTE — Progress Notes (Signed)
Watching pt while Brian Owens transports

## 2015-10-04 NOTE — Progress Notes (Signed)
Pt refused to wear sacral dressing, reported, "I have enough padding in that area".

## 2015-10-04 NOTE — Progress Notes (Signed)
Awake, alert, conversant.  Full strength both deltoids, biceps, triceps, HIs.  MAEW.  Doing well.

## 2015-10-04 NOTE — Brief Op Note (Signed)
10/04/2015  1:08 PM  PATIENT:  Brian Owens  74 y.o. male  PRE-OPERATIVE DIAGNOSIS:  Displacement of intervertebral disc of mid cervical region C45, C 56, C 67 levels with spondylosis, stenosis, radiculopathy  POST-OPERATIVE DIAGNOSIS:  Displacement of intervertebral disc of mid cervical region C45, C 56, C 67 levels with spondylosis, stenosis, radiculopathy  PROCEDURE:  Procedure(s) with comments: Cervical four-five, Cervical five-six, Cervical six-seven Anterior cervical decompression/diskectomy/fusion (N/A) - C4-5 C5-6 C6-7 Anterior cervical decompression/diskectomy/fusion with PEEK cages, autograft, allograft, plate  SURGEON:  Surgeon(s) and Role:    * Erline Levine, MD - Primary    * Eustace Moore, MD - Assisting  PHYSICIAN ASSISTANT:   ASSISTANTS: Poteat, RN   ANESTHESIA:   general  EBL:  Total I/O In: 1000 [I.V.:1000] Out: -   BLOOD ADMINISTERED:none  DRAINS: (10) Jackson-Pratt drain(s) with closed bulb suction in the prevertebral space   LOCAL MEDICATIONS USED:  LIDOCAINE   SPECIMEN:  No Specimen  DISPOSITION OF SPECIMEN:  N/A  COUNTS:  YES  TOURNIQUET:  * No tourniquets in log *  DICTATION: Patient was brought to operating room and following the smooth and uncomplicated induction of general endotracheal anesthesia his head was placed on a horseshoe head holder he was placed in 5 pounds of Holter traction and his anterior neck was prepped and draped in usual sterile fashion. An incision was made on the left side of midline after infiltrating the skin and subcutaneous tissues with local lidocaine. The platysmal layer was incised and subplatysmal dissection was performed exposing the anterior border sternocleidomastoid muscle. Using blunt dissection the carotid sheath was kept lateral and trachea and esophagus kept medial exposing the anterior cervical spine. A bent spinal needle was placed it was felt to be the C34 level and this was confirmed on intraoperative x-ray.  Longus coli muscles were taken down from the anterior cervical spine using electrocautery and key elevator and self-retaining retractor was placed to expose the C 45, C 56, C 67 levels. The interspace at C 45 was incised and a thorough discectomy was performed. Distraction pins were placed. Uncinate spurs and central spondylitic ridges were drilled down with a high-speed drill. The spinal cord dura and both C5 nerve roots were widely decompressed. A large amount of herniated disc material was removed from overlying the lateral aspect of the spinal cord and left C 5 nerve root.  Hemostasis was assured. After trial sizing a 7 mm peek interbody cage was selected and packed with autograft and DBM. The graft was tamped into position and countersunk appropriately. The retractor was moved and the interspace at C 67 was incised and a thorough discectomy was performed. Distraction pins were placed. Uncinate spurs and central spondylitic ridges were drilled down with a high-speed drill. The spinal cord dura and both C 7 nerve roots were widely decompressed. Hemostasis was assured. After trial sizing a 7 mm peek interbody cage was selected and packed in a similar fashion. The graft was tamped into position and countersunk appropriately.The interspace at C 56 was incised and a thorough discectomy was performed. Distraction pins were placed. Uncinate spurs and central spondylitic ridges were drilled down with a high-speed drill. The spinal cord dura and both C 6 nerve roots were widely decompressed. A large osteophyte was removed on the left with decompression of the left C 6 nerve root. Hemostasis was assured. After trial sizing a 76mm peek interbody cage was selected and packed in a similar fashion. The graft was tamped into position  and countersunk appropriately.  Distraction weight was removed. A 54 mm Nuvasive Archon anterior cervical plate was affixed to the cervical spine with 13 mm variable-angle screws 2 at C4, 2 at C5,  2 at C6,  and 2 at C7. All screws were well-positioned and locking mechanisms were engaged. Soft tissues were inspected and found to be in good repair. The wound was irrigated. A final x-ray was obtained with good visualization at C4 with the interbody graft well visualized. A #10 JP drain was inserted through a separate stab incision. The platysma layer was closed with 3-0 Vicryl stitches and the skin was reapproximated with 3-0 Vicryl subcuticular stitches. The wound was dressed with Dermabond. Counts were correct at the end of the case. Patient was extubated and taken to recovery in stable and satisfactory condition.    PLAN OF CARE: Admit to inpatient   PATIENT DISPOSITION:  PACU - hemodynamically stable.   Delay start of Pharmacological VTE agent (>24hrs) due to surgical blood loss or risk of bleeding: yes

## 2015-10-04 NOTE — Op Note (Signed)
10/04/2015  1:08 PM  PATIENT:  Brian Owens  74 y.o. male  PRE-OPERATIVE DIAGNOSIS:  Displacement of intervertebral disc of mid cervical region C45, C 56, C 67 levels with spondylosis, stenosis, radiculopathy  POST-OPERATIVE DIAGNOSIS:  Displacement of intervertebral disc of mid cervical region C45, C 56, C 67 levels with spondylosis, stenosis, radiculopathy  PROCEDURE:  Procedure(s) with comments: Cervical four-five, Cervical five-six, Cervical six-seven Anterior cervical decompression/diskectomy/fusion (N/A) - C4-5 C5-6 C6-7 Anterior cervical decompression/diskectomy/fusion with PEEK cages, autograft, allograft, plate  SURGEON:  Surgeon(s) and Role:    * Erline Levine, MD - Primary    * Eustace Moore, MD - Assisting  PHYSICIAN ASSISTANT:   ASSISTANTS: Poteat, RN   ANESTHESIA:   general  EBL:  Total I/O In: 1000 [I.V.:1000] Out: -   BLOOD ADMINISTERED:none  DRAINS: (10) Jackson-Pratt drain(s) with closed bulb suction in the prevertebral space   LOCAL MEDICATIONS USED:  LIDOCAINE   SPECIMEN:  No Specimen  DISPOSITION OF SPECIMEN:  N/A  COUNTS:  YES  TOURNIQUET:  * No tourniquets in log *  DICTATION: Patient was brought to operating room and following the smooth and uncomplicated induction of general endotracheal anesthesia his head was placed on a horseshoe head holder he was placed in 5 pounds of Holter traction and his anterior neck was prepped and draped in usual sterile fashion. An incision was made on the left side of midline after infiltrating the skin and subcutaneous tissues with local lidocaine. The platysmal layer was incised and subplatysmal dissection was performed exposing the anterior border sternocleidomastoid muscle. Using blunt dissection the carotid sheath was kept lateral and trachea and esophagus kept medial exposing the anterior cervical spine. A bent spinal needle was placed it was felt to be the C34 level and this was confirmed on intraoperative x-ray.  Longus coli muscles were taken down from the anterior cervical spine using electrocautery and key elevator and self-retaining retractor was placed to expose the C 45, C 56, C 67 levels. The interspace at C 45 was incised and a thorough discectomy was performed. Distraction pins were placed. Uncinate spurs and central spondylitic ridges were drilled down with a high-speed drill. The spinal cord dura and both C5 nerve roots were widely decompressed. A large amount of herniated disc material was removed from overlying the lateral aspect of the spinal cord and left C 5 nerve root.  Hemostasis was assured. After trial sizing a 7 mm peek interbody cage was selected and packed with autograft and DBM. The graft was tamped into position and countersunk appropriately. The retractor was moved and the interspace at C 67 was incised and a thorough discectomy was performed. Distraction pins were placed. Uncinate spurs and central spondylitic ridges were drilled down with a high-speed drill. The spinal cord dura and both C 7 nerve roots were widely decompressed. Hemostasis was assured. After trial sizing a 7 mm peek interbody cage was selected and packed in a similar fashion. The graft was tamped into position and countersunk appropriately.The interspace at C 56 was incised and a thorough discectomy was performed. Distraction pins were placed. Uncinate spurs and central spondylitic ridges were drilled down with a high-speed drill. The spinal cord dura and both C 6 nerve roots were widely decompressed. A large osteophyte was removed on the left with decompression of the left C 6 nerve root. Hemostasis was assured. After trial sizing a 23mm peek interbody cage was selected and packed in a similar fashion. The graft was tamped into position  and countersunk appropriately.  Distraction weight was removed. A 54 mm Nuvasive Archon anterior cervical plate was affixed to the cervical spine with 13 mm variable-angle screws 2 at C4, 2 at C5,  2 at C6,  and 2 at C7. All screws were well-positioned and locking mechanisms were engaged. Soft tissues were inspected and found to be in good repair. The wound was irrigated. A final x-ray was obtained with good visualization at C4 with the interbody graft well visualized. A #10 JP drain was inserted through a separate stab incision. The platysma layer was closed with 3-0 Vicryl stitches and the skin was reapproximated with 3-0 Vicryl subcuticular stitches. The wound was dressed with Dermabond. Counts were correct at the end of the case. Patient was extubated and taken to recovery in stable and satisfactory condition.    PLAN OF CARE: Admit to inpatient   PATIENT DISPOSITION:  PACU - hemodynamically stable.   Delay start of Pharmacological VTE agent (>24hrs) due to surgical blood loss or risk of bleeding: yes

## 2015-10-04 NOTE — Transfer of Care (Signed)
Immediate Anesthesia Transfer of Care Note  Patient: Brian Owens  Procedure(s) Performed: Procedure(s) with comments: Cervical four-five, Cervical five-six, Cervical six-seven Anterior cervical decompression/diskectomy/fusion (N/A) - C4-5 C5-6 C6-7 Anterior cervical decompression/diskectomy/fusion  Patient Location: PACU  Anesthesia Type:General  Level of Consciousness: awake, alert , oriented and patient cooperative  Airway & Oxygen Therapy: Patient Spontanous Breathing and Patient connected to nasal cannula oxygen  Post-op Assessment: Report given to RN, Post -op Vital signs reviewed and stable and Patient moving all extremities X 4  Post vital signs: Reviewed and stable  Last Vitals:  Filed Vitals:   10/04/15 1308  BP: 118/59  Pulse: 76  Temp: 37.1 C  Resp: 20    Complications: No apparent anesthesia complications

## 2015-10-04 NOTE — Plan of Care (Signed)
Problem: Consults Goal: Diagnosis - Spinal Surgery Outcome: Completed/Met Date Met:  10/04/15 Cervical Spine Fusion     

## 2015-10-04 NOTE — Anesthesia Procedure Notes (Signed)
Procedure Name: Intubation Date/Time: 10/04/2015 10:17 AM Performed by: Carney Living Pre-anesthesia Checklist: Patient identified, Emergency Drugs available, Suction available, Patient being monitored and Timeout performed Patient Re-evaluated:Patient Re-evaluated prior to inductionOxygen Delivery Method: Circle system utilized Preoxygenation: Pre-oxygenation with 100% oxygen Intubation Type: IV induction Ventilation: Mask ventilation without difficulty Laryngoscope Size: Mac and 4 Grade View: Grade I Tube type: Oral Tube size: 7.0 mm Number of attempts: 1 Airway Equipment and Method: Stylet Placement Confirmation: ETT inserted through vocal cords under direct vision,  positive ETCO2 and breath sounds checked- equal and bilateral Secured at: 22 cm Dental Injury: Teeth and Oropharynx as per pre-operative assessment

## 2015-10-04 NOTE — Progress Notes (Signed)
Orthopedic Tech Progress Note Patient Details:  Brian Owens 11-02-1941 [id number] Patient has soft collar Patient ID: Brian Owens, male   DOB: June 15, 1941, 74 y.o.   MRN: [id number]   Brian Owens 10/04/2015, 5:51 PM

## 2015-10-04 NOTE — Progress Notes (Signed)
Utilization review completed.  

## 2015-10-04 NOTE — Addendum Note (Signed)
Addendum  created 10/04/15 1420 by Carney Living, CRNA   Modules edited: Anesthesia Medication Administration

## 2015-10-04 NOTE — H&P (Signed)
Patient ID:   [id number] Patient: Brian Owens  Date of Birth: [birth year] Visit Type: Office Visit   Date: 08/08/2015 03:45 PM Provider: Marchia Meiers. Vertell Limber MD   This 74 year old male presents for neck pain and numbness.  History of Present Illness: 1.  neck pain  2.  numbness  I met with the patient and his wife again and reviewed his examination and imaging findings.  We went over the structural abnormalities and his multiple medical problems in great detail and I spent approximately 45 minutes in direct patient contact and discussion.  The patient has continued to complain of neck spasms and left arm tingling.  His previous imaging, which consisted of cervical radiographs, showed degenerative disc disease at the C5 C6 and C6 C7 levels.  I shows a significant disc herniation at C4 C5 on the left as well as bilateral foraminal stenosis at C5 C6 and C6 C7 levels.  Examination demonstrates deltoid weakness on the left at 4-5 and left biceps weakness at 4-4 minus out of 5.  The patient continues to complain of significant pain.  His imaging also demonstrates loss of normal cervical lordosis.  The patient has a history of vocal cord paralysis which resolved but has not had any recent evaluation of vocal cord function and I suggested that before proceeding with anterior cervical surgery he should be evaluated by your nose and throat physician to make sure that his vocal cords are both functioning properly.  I have recommended to the patient that he undergo anterior cervical decompression and fusion at the C4 C5, C5 C6, C6 C7 levels.  We went over tendon risks and benefits of surgery in great detail.  The patient is on Coumadin and will need to come off his Coumadin and have bridging Lovenox prior to surgery.  He is fitted for a soft cervical collar.  The patient wishes to proceed with surgery at the [facility] with overnight stay as long as his insurance will cover it.  After I  met with the patient, Aaron Edelman, my nurse, also met with the patient and his wife and one over models and discussed the surgery in more detail.      Medical/Surgical/Interim History Reviewed, no change.  Last detailed document date:07/30/2015.   PAST MEDICAL HISTORY, SURGICAL HISTORY, FAMILY HISTORY, SOCIAL HISTORY AND REVIEW OF SYSTEMS I have reviewed the patient's past medical, surgical, family and social history as well as the comprehensive review of systems as included on the [facility] history form dated 07/30/2015, which I have signed.  Family History: Reviewed, no changes.  Last detailed document: 07/30/2015.   Social History: Tobacco use reviewed. Reviewed, no changes. Last detailed document date: 07/30/2015.      MEDICATIONS(added, continued or stopped this visit): Started Medication Directions Instruction Stopped   Alphagan P 0.15 % eye drops instill 1 drop by ophthalmic route 3 times every day into affected eye(s)     Ambien 10 mg tablet take 1 tablet by oral route  every day at bedtime as needed for sleep     amlodipine 5 mg tablet take 1 tablet by oral route  every day     Anafranil 75 mg capsule take 1 capsule by oral route  every day     aspirin 81 mg tablet,delayed release take 1 tablet by oral route  every day     Betimol 0.5 % eye drops instill 1 drop by ophthalmic route 2 times every day into affected  eye(s)     Calcium 500 500 mg calcium (1,250 mg) tablet Take once daily     Cialis 20 mg tablet take 1 tablet by oral route  every day as needed     Cialis 5 mg tablet take 1 tablet by oral route  every day     Co Q-10 300 mg capsule Take once daily     Crestor 20 mg tablet take 1 tablet by oral route  every day     Effexor XR 150 mg capsule,extended release take 1 capsule by oral route  every day     fenofibrate 160 mg tablet take 1 tablet by oral route  every day     fish oil 1200 ORAL TABLET Take once daily     lisinopril 20 mg tablet  take 1 tablet by oral route 2 times every day     multivitamin tablet Take once daily     Prilosec OTC 20 mg tablet,delayed release Take once daily     Travatan Z 0.004 % eye drops instill 1 drop by ophthalmic route  every day into affected eye(s) in the evening     trazodone 50 mg tablet take 1 tablet by oral route  every day     Vitamin D2 50,000 unit capsule take 1 capsule by oral route  every week     warfarin 5 mg tablet take 1 tablet by oral route  every day     Xanax 0.5 mg tablet Take as needed for anxiety       ALLERGIES: Ingredient Reaction Medication Name Comment  SULFA (SULFONAMIDE ANTIBIOTICS) Unknown    NOREPINEPHRINE Anxiety    EPINEPHRINE Anxiety     Reviewed, no changes.    Vitals Date Temp F BP Pulse Ht In Wt Lb BMI BSA Pain Score  08/08/2015  137/61 73 68 181 27.52  0/10      IMPRESSION Significant cervical spondylosis and disc herniations at C4 C5, C5 C6, C6 C7 levels.  Completed Orders (this encounter) Order Details Reason Side Interpretation Result Initial Treatment Date Region  Lifestyle education regarding diet Encouraged to eat a well balanced diet and follow up with primary care physician.         Assessment/Plan # Detail Type Description   1. Assessment Cervicalgia (M54.2).       2. Assessment Cervical radiculopathy (M54.12).       3. Assessment Displacement of intervertebral disc of mid-cervical region (M50.22).       4. Assessment Body mass index (BMI) 27.0-27.9, adult (W43.15).   Plan Orders Today's instructions / counseling include(s) Lifestyle education regarding diet.         Pain Assessment/Treatment Pain Scale: 0/10. Method: Numeric Pain Intensity Scale. Location: neck. Onset: 04/09/2015. Duration: varies. Quality: discomforting. Pain Assessment/Treatment follow-up plan of care: Patient is taking medications as prescribed..  Proceed with anterior cervical decompression and fusion C4 C5, C5 C6, C6 C7 levels.  The patient  needs to be on bridging Lovenox prior to surgery and also will need ENT evaluation to assess vocal cord function prior to proceeding with surgery.  Orders: Diagnostic Procedures: Assessment Procedure  M50.22 ACDF - C4-C5 - C5-C6 - C6-C7  M54.2 Wilburn Cornelia, ENT check vocal cord (previous paralysis, needs ACDF)  Instruction(s)/Education: Assessment Instruction  [phone] Lifestyle education regarding diet             Provider:  Marchia Meiers. Vertell Limber MD  08/11/2015 03:55 PM Dictation edited by: Marchia Meiers. Vertell Limber    CC Providers:  Gaynelle Arabian [address], [address] [place], [place] [address]              Electronically signed by Marchia Meiers. Vertell Limber MD on 08/11/2015 03:55 PM  Patient ID:   [id number] Patient: Brian Owens  Date of Birth: 1941/04/23 Visit Type: Office Visit   Date: 07/30/2015 01:00 PM Provider: Marchia Meiers. Vertell Limber MD   This 74 year old male presents for neck pain and numbness.  History of Present Illness: 1.  neck pain  2.  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  Patient reports long history of neck spasms and back spasms, typically resolving after two weeks of activity modification and only occasionally requiring Valium and a cervical collar.  Current symptoms have lasted the longest and have been the most severe in many years.  Aleve only as needed  Coumadin (history retinal thrombosis, DVT)  History: BPH, HTN, NIDDM, glaucoma, skin cancer, anxiety/depression, central retinal vein thrombosis 2001 OD, postop DVT 2002 & vocal cord paralysis  Surgical history: 2002 right total hip replacement, 2004 right shoulder fracture  X-rays on [facility]  Cervical radiographs demonstrate degenerative changes at the C5 C6 and C6 C7 levels.  The patient complains of neck spasms and pain into his left arm.  He describes that these  symptoms are not improving.  He complains of pain radiating into his neck, left shoulder along with numbness and tingling into the upper left arm radiating into his left thumb and involving his entire left hand.  He does not complain of right upper extremity symptoms.        PAST MEDICAL/SURGICAL HISTORY   (Detailed)  Disease/disorder Onset Date Management Date Comments    Hip replacement 2002     Right shoulder 2004   Arthritis      Diabetes mellitus      High cholesterol      Hypertension         PAST MEDICAL HISTORY, SURGICAL HISTORY, FAMILY HISTORY, SOCIAL HISTORY AND REVIEW OF SYSTEMS I have reviewed the patient's past medical, surgical, family and social history as well as the comprehensive review of systems as included on the [facility] history form dated 07/30/2015, which I have signed.  Family History  (Detailed) Relationship Family Member Name Deceased Age at Death Condition Onset Age Cause of Death  Mother    Diabetes mellitus  N  Mother    Stroke  N    SOCIAL HISTORY  (Detailed) Tobacco use reviewed. Preferred language is Unknown.   Smoking status: Never smoker.  SMOKING STATUS Use Status Type Smoking Status Usage Per Day Years Used Total Pack Years  no/never  Never smoker       HOME ENVIRONMENT/SAFETY The patient has not fallen in the last year.        MEDICATIONS(added, continued or stopped this visit): Started Medication Directions Instruction Stopped   Alphagan P 0.15 % eye drops instill 1 drop by ophthalmic route 3 times every day into affected eye(s)     Ambien 10 mg tablet take 1 tablet by oral route  every day at bedtime as needed for sleep     amlodipine 5 mg tablet take 1 tablet by oral route  every day     Anafranil 75 mg capsule take 1 capsule by oral route  every day     aspirin 81 mg tablet,delayed release take 1  tablet by oral route  every day     Betimol 0.5 % eye drops instill 1 drop by ophthalmic route 2  times every day into affected eye(s)     Calcium 500 500 mg calcium (1,250 mg) tablet Take once daily     Cialis 20 mg tablet take 1 tablet by oral route  every day as needed     Cialis 5 mg tablet take 1 tablet by oral route  every day     Co Q-10 300 mg capsule Take once daily     Crestor 20 mg tablet take 1 tablet by oral route  every day     Effexor XR 150 mg capsule,extended release take 1 capsule by oral route  every day     fenofibrate 160 mg tablet take 1 tablet by oral route  every day     fish oil 1200 ORAL TABLET Take once daily     lisinopril 20 mg tablet take 1 tablet by oral route 2 times every day     multivitamin tablet Take once daily     Prilosec OTC 20 mg tablet,delayed release Take once daily     Travatan Z 0.004 % eye drops instill 1 drop by ophthalmic route  every day into affected eye(s) in the evening     trazodone 50 mg tablet take 1 tablet by oral route  every day     Vitamin D2 50,000 unit capsule take 1 capsule by oral route  every week     Xanax 0.5 mg tablet Take as needed for anxiety       ALLERGIES: Ingredient Reaction Medication Name Comment  SULFA (SULFONAMIDE ANTIBIOTICS) Unknown    NOREPINEPHRINE Anxiety    EPINEPHRINE Anxiety     Reviewed, updated.   REVIEW OF SYSTEMS System Neg/Pos Details  Constitutional Negative Chills, fatigue, fever, malaise, night sweats, weight gain and weight loss.  ENMT Negative Ear drainage, hearing loss, nasal drainage, otalgia, sinus pressure and sore throat.  Eyes Negative Eye discharge, eye pain and vision changes.  Respiratory Negative Chronic cough, cough, dyspnea, known TB exposure and wheezing.  Cardio Negative Chest pain, claudication, edema and irregular heartbeat/palpitations.  GI Negative Abdominal pain, blood in stool, change in stool pattern, constipation, decreased appetite, diarrhea, heartburn, nausea and vomiting.  GU Negative Dribbling, dysuria, erectile dysfunction, hematuria, polyuria, slow stream,  urinary frequency, urinary incontinence and urinary retention.  Endocrine Negative Cold intolerance, heat intolerance, polydipsia and polyphagia.  Neuro Negative Dizziness, extremity weakness, gait disturbance, headache, memory impairment, numbness in extremity, seizures and tremors.  Psych Negative Anxiety, depression and insomnia.  Integumentary Negative Brittle hair, brittle nails, change in shape/size of mole(s), hair loss, hirsutism, hives, pruritus, rash and skin lesion.  MS Positive Neck pain.  Hema/Lymph Negative Easy bleeding, easy bruising and lymphadenopathy.  Allergic/Immuno Negative Contact allergy, environmental allergies, food allergies and seasonal allergies.  Reproductive Negative Penile discharge and sexual dysfunction.     Vitals Date Temp F BP Pulse Ht In Wt Lb BMI BSA Pain Score  07/30/2015  157/58 62 68 180 27.37  2/10     PHYSICAL EXAM General Level of Distress: no acute distress Overall Appearance: normal  Head and Face  Right Left  Fundoscopic Exam:  normal normal    Cardiovascular Cardiac: regular rate and rhythm without murmur  Right Left  Carotid Pulses: normal normal  Respiratory Lungs: clear to auscultation  Neurological Orientation: normal Recent and Remote Memory: normal Attention Span and Concentration:   normal Language:  normal Fund of Knowledge: normal  Right Left Sensation: normal normal Upper Extremity Coordination: normal normal  Lower Extremity Coordination: normal normal  Musculoskeletal Gait and Station: normal  Right Left Upper Extremity Muscle Strength: normal normal Lower Extremity Muscle Strength: normal normal Upper Extremity Muscle Tone:  normal normal Lower Extremity Muscle Tone: normal normal  Motor Strength Upper and lower extremity motor strength was tested in the clinically pertinent muscles. Any abnormal findings will be noted below.   Right Left Biceps:  4-/5 Wrist Extensor:  4/5   Deep Tendon  Reflexes  Right Left Biceps: normal absent Triceps: normal normal Brachiloradialis: normal normal Patellar: normal normal Achilles: normal normal  Sensory Sensation was tested at C2 to T1. Any abnormal findings will be noted below.  Right Left C6:  decreased  Cranial Nerves II. Optic Nerve/Visual Fields: normal III. Oculomotor: normal IV. Trochlear: normal V. Trigeminal: normal VI. Abducens: normal VII. Facial: normal VIII. Acoustic/Vestibular: normal IX. Glossopharyngeal: normal X. Vagus: normal XI. Spinal Accessory: normal XII. Hypoglossal: normal  Motor and other Tests Lhermittes: negative Rhomberg: negative Pronator drift: absent     Right Left Spurlings negative positive Hoffman's: normal normal Clonus: normal normal Babinski: normal normal SLR: negative negative Patrick's Corky Sox): negative negative Toe Walk: normal normal Toe Lift: normal normal Heel Walk: normal normal Tinels Elbow: negative negative Tinels Wrist: negative negative Phalen: negative negative   Additional Findings:  Negative shoulder impingement testing.    IMPRESSION The patient has a significant left C6 radiculopathy.  He has degenerative changes on cervical radiographs at C5 C6 and C6 C7 levels.  I have recommended that the patient undergo an MRI of his cervical spine on an expedited basis.  Completed Orders (this encounter) Order Details Reason Side Interpretation Result Initial Treatment Date Region  Cervical Spine- AP/Lat/Obls/Odontoid/Flex/Ex      07/30/2015 All Levels to All Levels  Hypertension education Follow up with primary care physician.        Lifestyle education regarding diet Encouraged to eat a well balanced diet and follow up with primary care physician.         Assessment/Plan # Detail Type Description   1. Assessment Cervicalgia (M54.2).       2. Assessment Spondylosis of cervical region without myelopathy or radiculopathy (M47.812).       3. Assessment  Cervical radiculopathy (M54.12).       4. Assessment Essential (primary) hypertension (I10).       5. Assessment Body mass index (BMI) 27.0-27.9, adult (K93.81).   Plan Orders Today's instructions / counseling include(s) Lifestyle education regarding diet.         Pain Assessment/Treatment Pain Scale: 2/10. Method: Numeric Pain Intensity Scale. Location: neck. Onset: 04/09/2015. Duration: varies. Quality: discomforting. Pain Assessment/Treatment follow-up plan of care: Patient is taking medications as prescribed..  Fall Risk Plan The patient has not fallen in the last year.  Patient will return to see me after cervical imaging has been performed.  Orders: Diagnostic Procedures: Assessment Procedure  M54.12 Cervical Spine- AP/Lat/Obls/Odontoid/Flex/Ex  M54.12 MRI Spinal/cerv W/o Contrast  Instruction(s)/Education: Assessment Instruction  I10 Hypertension education  Z68.27 Lifestyle education regarding diet             Provider:  Marchia Meiers. Vertell Limber MD  08/02/2015 11:47 AM Dictation edited by: Marchia Meiers. Vertell Limber    CC Providers: Gaynelle Arabian [address], [address] [place], [place] [address]              Electronically signed by Marchia Meiers. Vertell Limber MD on  08/02/2015 11:47 AM

## 2015-10-05 ENCOUNTER — Encounter (HOSPITAL_COMMUNITY): Payer: Self-pay | Admitting: Neurosurgery

## 2015-10-05 LAB — GLUCOSE, CAPILLARY: GLUCOSE-CAPILLARY: 93 mg/dL (ref 65–99)

## 2015-10-05 MED ORDER — HYDROCODONE-ACETAMINOPHEN 5-325 MG PO TABS: 1.0000 | ORAL_TABLET | Freq: Four times a day (QID) | ORAL | Status: AC | PRN

## 2015-10-05 NOTE — Evaluation (Signed)
Physical Therapy Evaluation and Discharge Patient Details Name: Brian Owens MRN: [id number] DOB: [birth year] Today's Date: 10/05/2015   History of Present Illness  Pt is a 74 y/o male who presents s/p C4-7 ACDF on 10/04/15.  Clinical Impression  Patient evaluated by Physical Therapy with no further acute PT needs identified. All education has been completed and the patient has no further questions. At the time of PT eval pt was able to perform transfers and ambulation with modified independence. Grossly slow and guarded but no physical assist required. See below for any follow-up Physial Therapy or equipment needs. PT is signing off. Thank you for this referral.     Follow Up Recommendations Outpatient PT;Supervision for mobility/OOB    Equipment Recommendations  None recommended by PT    Recommendations for Other Services       Precautions / Restrictions Precautions Precautions: Fall;Cervical Precaution Comments: Reviewed handout and precautions with pt and wife Required Braces or Orthoses: Cervical Brace Cervical Brace: Soft collar Restrictions Weight Bearing Restrictions: No      Mobility  Bed Mobility Overal bed mobility: Modified Independent             General bed mobility comments: No assist required. VC's for sequencing and technique.   Transfers Overall transfer level: Needs assistance Equipment used: None Transfers: Sit to/from Stand Sit to Stand: Modified independent (Device/Increase time)            Ambulation/Gait Ambulation/Gait assistance: Modified independent (Device/Increase time) Ambulation Distance (Feet): 350 Feet Assistive device: None Gait Pattern/deviations: Step-through pattern;Decreased stride length Gait velocity: Decreased Gait velocity interpretation: Below normal speed for age/gender General Gait Details: Slow and guarded but grossly mod I - no assist required.   Stairs Stairs: Yes Stairs assistance: Min guard Stair  Management: One rail Right Number of Stairs: 4 General stair comments: VC's for sequencing and safety awareness.   Wheelchair Mobility    Modified Rankin (Stroke Patients Only)       Balance Overall balance assessment: No apparent balance deficits (not formally assessed)                                           Pertinent Vitals/Pain Pain Assessment: Faces Faces Pain Scale: Hurts little more Pain Location: Neck Pain Descriptors / Indicators: Operative site guarding;Discomfort Pain Intervention(s): Limited activity within patient's tolerance;Monitored during session;Repositioned    Home Living Family/patient expects to be discharged to:: Private residence Living Arrangements: Spouse/significant other Available Help at Discharge: Family;Available 24 hours/day Type of Home: House Home Access: Stairs to enter Entrance Stairs-Rails: None (Wall on the L side for support) Entrance Stairs-Number of Steps: 3 Home Layout: Two level;Able to live on main level with bedroom/bathroom Home Equipment: Bedside commode      Prior Function Level of Independence: Independent               Hand Dominance   Dominant Hand: Right    Extremity/Trunk Assessment   Upper Extremity Assessment: Defer to OT evaluation           Lower Extremity Assessment: Generalized weakness      Cervical / Trunk Assessment: Other exceptions  Communication   Communication: No difficulties  Cognition Arousal/Alertness: Awake/alert Behavior During Therapy: WFL for tasks assessed/performed Overall Cognitive Status: Within Functional Limits for tasks assessed  General Comments      Exercises        Assessment/Plan    PT Assessment Patent does not need any further PT services  PT Diagnosis Difficulty walking;Acute pain   PT Problem List    PT Treatment Interventions     PT Goals (Current goals can be found in the Care Plan section) Acute  Rehab PT Goals Patient Stated Goal: Home today PT Goal Formulation: With patient/family Time For Goal Achievement: 10/12/15 Potential to Achieve Goals: Good    Frequency     Barriers to discharge        Co-evaluation               End of Session Equipment Utilized During Treatment: Gait belt;Cervical collar Activity Tolerance: Patient tolerated treatment well Patient left: in chair;with call bell/phone within reach;with family/visitor present Nurse Communication: Mobility status         Time: 8295-6213 PT Time Calculation (min) (ACUTE ONLY): 18 min   Charges:   PT Evaluation $Initial PT Evaluation Tier I: 1 Procedure     PT G CodesRolinda Roan 2015/10/27, 9:33 AM   Rolinda Roan, PT, DPT Acute Rehabilitation Services Pager: [phone]

## 2015-10-05 NOTE — Discharge Instructions (Signed)

## 2015-10-05 NOTE — Discharge Summary (Cosign Needed)
Physician Discharge Summary  Patient ID: Brian Owens MRN: [id number] DOB/AGE: [birth year] 74 y.o.  Admit date: 10/04/2015 Discharge date: 10/05/2015  Admission Diagnoses: Displacement of intervertebral disc of mid cervical region C45, C 56, C 67 levels with spondylosis, stenosis, radiculopathy   Discharge Diagnoses: Displacement of intervertebral disc of mid cervical region C45, C 56, C 67 levels with spondylosis, stenosis, radiculopathy s/p Cervical four-five, Cervical five-six, Cervical six-seven Anterior cervical decompression/diskectomy/fusion (N/A) - C4-5 C5-6 C6-7 Anterior cervical decompression/diskectomy/fusion with PEEK cages, autograft, allograft, plate  Active Problems:   Herniated cervical intervertebral disc   Discharged Condition: good  Hospital Course: Brian Owens was admitted for surgery with dx cervical spondylosis, stenosis, and radiculopathy. Following uncomplicated ACDF levels L8-L3, pt recovered well and transferred to [facility] for nursing care. He has progressed nicely.   Consults: None  Significant Diagnostic Studies: radiology: X-Ray: intra-operative  Treatments: surgery: Cervical four-five, Cervical five-six, Cervical six-seven Anterior cervical decompression/diskectomy/fusion (N/A) - C4-5 C5-6 C6-7 Anterior cervical decompression/diskectomy/fusion with PEEK cages, autograft, allograft, plate   Discharge Exam: Blood pressure 138/63, pulse 69, temperature 97.6 F (36.4 C), temperature source Oral, resp. rate 18, weight 82.101 kg (181 lb), SpO2 97 %. Alert, conversant. Wife at bedside. No report of dysphagia. Pain well-controlled, notes mild posterior cervical/shoulder soreness. No arm pain, no numbness. Full strength BUE and hand intrinsics. Incision flat, without erythema, swelling, or drainage. JP o/p ~20ml overnight - thin now.    Disposition: Discharge to home with rx's Percocet 5/325 1-2 po q4hrs prn pain #50 and Robaxin 500mg  1 po TID prn spasm #60. JP  pulled. No drainage at site. DSD applied. Rx's to chart. Pt & wife verbalize understanding of d/c instructions and agree to call to set up 3 week post-op appt. Plan in effect to resume anticoagulant via primary MD. Aleve is stopped.      Medication List    ASK your doctor about these medications        ALEVE 220 MG tablet  Generic drug:  naproxen sodium  Take 220 mg by mouth 2 (two) times daily as needed (Knee pain).     ALPRAZolam 0.5 MG tablet  Commonly known as:  XANAX  Take 0.5 mg by mouth 2 (two) times daily as needed for anxiety.     amLODipine 5 MG tablet  Commonly known as:  NORVASC  Take 5 mg by mouth daily.     aspirin 81 MG tablet  Take 81 mg by mouth daily.     brimonidine 0.15 % ophthalmic solution  Commonly known as:  ALPHAGAN  Place 1 drop into both eyes 3 (three) times daily.     clomiPRAMINE 75 MG capsule  Commonly known as:  ANAFRANIL  Take 75 mg by mouth at bedtime.     Coenzyme Q10 300 MG Caps  Take 1 capsule by mouth daily.     fenofibrate 160 MG tablet  Take 160 mg by mouth daily.     flurazepam 30 MG capsule  Commonly known as:  DALMANE  Take 30 mg by mouth at bedtime as needed for sleep.     lisinopril 20 MG tablet  Commonly known as:  PRINIVIL,ZESTRIL  Take 40 mg by mouth daily.     multivitamin with minerals tablet  Take 1 tablet by mouth daily.     Omega 3 1200 MG Caps  Take 1 capsule by mouth daily.     omeprazole 20 MG capsule  Commonly known as:  PRILOSEC  Take 20 mg by  mouth daily.     oyster calcium 500 MG Tabs tablet  Take 500 mg of elemental calcium by mouth daily.     rosuvastatin 20 MG tablet  Commonly known as:  CRESTOR  Take 20 mg by mouth daily.     tadalafil 5 MG tablet  Commonly known as:  CIALIS  Take 5 mg by mouth daily.     timolol 0.5 % ophthalmic solution  Commonly known as:  BETIMOL  Place 1 drop into both eyes 2 (two) times daily.     travoprost (benzalkonium) 0.004 % ophthalmic solution  Commonly  known as:  TRAVATAN  Place 1 drop into both eyes at bedtime.     traZODone 50 MG tablet  Commonly known as:  DESYREL  Take 50 mg by mouth at bedtime.     venlafaxine XR 150 MG 24 hr capsule  Commonly known as:  EFFEXOR-XR  Take 150 mg by mouth daily with breakfast.     Vitamin D (Ergocalciferol) 50000 UNITS Caps capsule  Commonly known as:  DRISDOL  Take 50,000 Units by mouth every 7 (seven) days.     warfarin 5 MG tablet  Commonly known as:  COUMADIN  Take 5 mg by mouth daily.     zolpidem 10 MG tablet  Commonly known as:  AMBIEN  Take 10 mg by mouth at bedtime as needed for sleep.         Signed: Verdis Prime 10/05/2015, 8:07 AM

## 2015-10-05 NOTE — Progress Notes (Signed)
Patient alert and oriented, mae's well, voiding adequate amount of urine, swallowing without difficulty, no c/o pain. Patient discharged home with family. Script and discharged instructions given to patient. Patient and family stated understanding of d/c instructions given and has an appointment with MD. 

## 2015-10-05 NOTE — Progress Notes (Signed)
Subjective: Patient reports "I feel pretty good...a little sore maybe"  Objective: Vital signs in last 24 hours: Temp:  [97.6 F (36.4 C)-98.8 F (37.1 C)] 97.6 F (36.4 C) (10/21 0400) Pulse Rate:  [66-97] 69 (10/21 0400) Resp:  [13-32] 18 (10/21 0400) BP: (115-177)/(50-105) 138/63 mmHg (10/21 0400) SpO2:  [94 %-98 %] 97 % (10/21 0400)  Intake/Output from previous day: 10/20 0701 - 10/21 0700 In: 2332.5 [P.O.:720; I.V.:1612.5] Out: 980 [Urine:800; Drains:80; Blood:100] Intake/Output this shift:    Alert, conversant. Wife at bedside. No report of dysphagia. Pain well-controlled, notes mild posterior cervical/shoulder soreness. No arm pain, no numbness. Full strength BUE and hand intrinsics. Incision flat, without erythema, swelling, or drainage. JP o/p ~15ml overnight - thin now.   Lab Results: No results for input(s): WBC, HGB, HCT, PLT in the last 72 hours. BMET No results for input(s): NA, K, CL, CO2, GLUCOSE, BUN, CREATININE, CALCIUM in the last 72 hours.  Studies/Results: Dg Cervical Spine 2-3 Views  10/04/2015  CLINICAL DATA:  Cervical spine surgery. EXAM: CERVICAL SPINE  4+ VIEWS COMPARISON:  None. FINDINGS: Metallic markers noted anteriorly at the C3-C4 disc space level. Normal alignment. No acute bony abnormality identified . IMPRESSION: Metallic marker noted anteriorly at the C3-C4 disc space level . Electronically Signed   By: Marcello Moores  Register   On: 10/04/2015 13:24    Assessment/Plan: Improving   LOS: 1 day  Per DrStern, pull JP, d/c to home with rx's Percocet 5/325 1-2 po q4hrs prn pain #50 and Robaxin 500mg  1 po TID prn spasm #60. JP pulled. No drainage at site. DSD applied. Rx's to chart. Pt & wife verbalize understanding of d/c instructions and agree to call to set up 3 week post-op appt.    Verdis Prime 10/05/2015, 8:02 AM

## 2015-10-05 NOTE — Progress Notes (Signed)
Occupational Therapy Evaluation Patient Details Name: Brian Owens MRN: [id number] DOB: 1941/03/22 Today's Date: 10/05/2015    History of Present Illness Pt is a 74 y/o male who presents s/p C4-7 ACDF on 10/04/15.   Clinical Impression   Patient presents to OT with decreased ADL independence. All education is complete and patient will have assistance from his wife at home during his recovery. OT will sign off.    Follow Up Recommendations  No OT follow up;Supervision - Intermittent    Equipment Recommendations  None recommended by OT    Recommendations for Other Services       Precautions / Restrictions Precautions Precautions: Fall;Cervical Precaution Comments: reviewed cervical precautions with patient and wife Required Braces or Orthoses: Cervical Brace Cervical Brace: Soft collar Restrictions Weight Bearing Restrictions: No      Mobility Bed Mobility                Transfers                  Balance                                           ADL Overall ADL's : Needs assistance/impaired Eating/Feeding: Set up;Sitting       Upper Body Bathing: Set up;Minimal assitance;Sitting   Lower Body Bathing: Minimal assistance;Sit to/from stand   Upper Body Dressing : Set up;Minimal assistance;Sitting   Lower Body Dressing: Minimal assistance;Moderate assistance;Sit to/from stand   Toilet Transfer: Modified Independent           Functional mobility during ADLs: Modified independent General ADL Comments: Patient educated on managing ADLs within cervical precautions. Wife to purchase long sponge, reacher, sock aid for LB ADLs. Patient has 3 in 1 and was educated to use over toilet, can be used in shower if needed. Educated patient on proper shower transfer technique and to have wife present first few times he does the task. Patient reports the AE for LB self-care will be a "back up" and he plans to have wife assist  for now.      Vision     Perception     Praxis      Pertinent Vitals/Pain Pain Assessment: Faces Faces Pain Scale: Hurts little more Pain Location: neck Pain Descriptors / Indicators: Discomfort;Operative site guarding Pain Intervention(s): Limited activity within patient's tolerance;Monitored during session     Hand Dominance Right   Extremity/Trunk Assessment Upper Extremity Assessment Upper Extremity Assessment: Overall WFL for tasks assessed (within cervical precautions)   Lower Extremity Assessment Lower Extremity Assessment: Defer to PT evaluation   Cervical / Trunk Assessment Cervical / Trunk Assessment: Other exceptions Cervical / Trunk Exceptions: Cervical incision   Communication Communication Communication: No difficulties   Cognition Arousal/Alertness: Awake/alert Behavior During Therapy: WFL for tasks assessed/performed Overall Cognitive Status: Within Functional Limits for tasks assessed                     General Comments       Exercises       Shoulder Instructions      Home Living Family/patient expects to be discharged to:: Private residence Living Arrangements: Spouse/significant other Available Help at Discharge: Family;Available 24 hours/day Type of Home: House Home Access: Stairs to enter [employer] of Steps: 3 Entrance Stairs-Rails: None Home Layout: Two level;Able to live on main level with bedroom/bathroom  Bathroom Shower/Tub: Tub/shower unit;Walk-in shower   Bathroom Toilet: Standard Bathroom Accessibility: Yes   Home Equipment: Bedside commode;Adaptive equipment Adaptive Equipment: Long-handled shoe horn Additional Comments: wife to purchase long sponge, reacher, sock aid      Prior Functioning/Environment Level of Independence: Independent             OT Diagnosis: Acute pain   OT Problem List: Decreased activity tolerance;Decreased knowledge of use of DME or AE;Decreased knowledge of  precautions;Pain   OT Treatment/Interventions:      OT Goals(Current goals can be found in the care plan section) Acute Rehab OT Goals Patient Stated Goal: Home today OT Goal Formulation: All assessment and education complete, DC therapy  OT Frequency:     Barriers to D/C:            Co-evaluation              End of Session Nurse Communication: Mobility status  Activity Tolerance: Patient tolerated treatment well Patient left: in chair;with call bell/phone within reach;with family/visitor present   Time: 5284-1324 OT Time Calculation (min): 18 min Charges:  OT General Charges $OT Visit: 1 Procedure OT Evaluation $Initial OT Evaluation Tier I: 1 Procedure G-Codes:    Mayjor Ager A 11-01-2015, 9:43 AM

## 2015-10-05 NOTE — Discharge Summary (Signed)
Physician Discharge Summary  Patient ID: Brian Owens MRN: [id number] DOB/AGE: 1941-07-01 74 y.o.  Admit date: 10/04/2015 Discharge date: 10/05/2015  Admission Diagnoses:cervical stenosis   Discharge Diagnoses: same   Discharged Condition: good  Hospital Course: The patient was admitted on 10/04/2015 and taken to the operating room where the patient underwent ACDF. The patient tolerated the procedure well and was taken to the recovery room and then to the floor in stable condition. The hospital course was routine. There were no complications. The wound remained clean dry and intact. Pt had appropriate neck soreness. No complaints of arm pain or new N/T/W. The patient remained afebrile with stable vital signs, and tolerated a regular diet. The patient continued to increase activities, and pain was well controlled with oral pain medications.   Consults: None  Significant Diagnostic Studies:  Results for orders placed or performed during the hospital encounter of 10/04/15  Glucose, capillary  Result Value Ref Range   Glucose-Capillary 102 (H) 65 - 99 mg/dL  APTT  Result Value Ref Range   aPTT 27 24 - 37 seconds  Protime-INR  Result Value Ref Range   Prothrombin Time 14.6 11.6 - 15.2 seconds   INR 1.12 0.00 - 1.49  Glucose, capillary  Result Value Ref Range   Glucose-Capillary 124 (H) 65 - 99 mg/dL  Glucose, capillary  Result Value Ref Range   Glucose-Capillary 128 (H) 65 - 99 mg/dL  Glucose, capillary  Result Value Ref Range   Glucose-Capillary 184 (H) 65 - 99 mg/dL  Glucose, capillary  Result Value Ref Range   Glucose-Capillary 122 (H) 65 - 99 mg/dL   Comment 1 Notify RN    Comment 2 Document in Chart   Glucose, capillary  Result Value Ref Range   Glucose-Capillary 93 65 - 99 mg/dL    Dg Cervical Spine 2-3 Views  10/04/2015  CLINICAL DATA:  Cervical spine surgery. EXAM: CERVICAL SPINE  4+ VIEWS COMPARISON:  None. FINDINGS: Metallic markers noted anteriorly at the  C3-C4 disc space level. Normal alignment. No acute bony abnormality identified . IMPRESSION: Metallic marker noted anteriorly at the C3-C4 disc space level . Electronically Signed   By: Marcello Moores  Register   On: 10/04/2015 13:24    Antibiotics:  Anti-infectives    Start     Dose/Rate Route Frequency Ordered Stop   10/04/15 1800  ceFAZolin (ANCEF) IVPB 1 g/50 mL premix     1 g 100 mL/hr over 30 Minutes Intravenous Every 8 hours 10/04/15 1629 10/05/15 0123   10/04/15 0745  ceFAZolin (ANCEF) IVPB 2 g/50 mL premix     2 g 100 mL/hr over 30 Minutes Intravenous On call to O.R. 10/04/15 0737 10/04/15 1018      Discharge Exam: Blood pressure 151/63, pulse 72, temperature 98.6 F (37 C), temperature source Oral, resp. rate 20, weight 181 lb (82.101 kg), SpO2 95 %. Neuro normal Dressing dry  Discharge Medications:     Medication List    STOP taking these medications        ALEVE 220 MG tablet  Generic drug:  naproxen sodium      TAKE these medications        ALPRAZolam 0.5 MG tablet  Commonly known as:  XANAX  Take 0.5 mg by mouth 2 (two) times daily as needed for anxiety.     amLODipine 5 MG tablet  Commonly known as:  NORVASC  Take 5 mg by mouth daily.     aspirin 81 MG tablet  Take  81 mg by mouth daily.     brimonidine 0.15 % ophthalmic solution  Commonly known as:  ALPHAGAN  Place 1 drop into both eyes 3 (three) times daily.     clomiPRAMINE 75 MG capsule  Commonly known as:  ANAFRANIL  Take 75 mg by mouth at bedtime.     Coenzyme Q10 300 MG Caps  Take 1 capsule by mouth daily.     fenofibrate 160 MG tablet  Take 160 mg by mouth daily.     flurazepam 30 MG capsule  Commonly known as:  DALMANE  Take 30 mg by mouth at bedtime as needed for sleep.     HYDROcodone-acetaminophen 5-325 MG tablet  Commonly known as:  NORCO/VICODIN  Take 1-2 tablets by mouth every 6 (six) hours as needed for moderate pain (mild pain).     lisinopril 20 MG tablet  Commonly known as:   PRINIVIL,ZESTRIL  Take 40 mg by mouth daily.     multivitamin with minerals tablet  Take 1 tablet by mouth daily.     Omega 3 1200 MG Caps  Take 1 capsule by mouth daily.     omeprazole 20 MG capsule  Commonly known as:  PRILOSEC  Take 20 mg by mouth daily.     oyster calcium 500 MG Tabs tablet  Take 500 mg of elemental calcium by mouth daily.     rosuvastatin 20 MG tablet  Commonly known as:  CRESTOR  Take 20 mg by mouth daily.     tadalafil 5 MG tablet  Commonly known as:  CIALIS  Take 5 mg by mouth daily.     timolol 0.5 % ophthalmic solution  Commonly known as:  BETIMOL  Place 1 drop into both eyes 2 (two) times daily.     travoprost (benzalkonium) 0.004 % ophthalmic solution  Commonly known as:  TRAVATAN  Place 1 drop into both eyes at bedtime.     traZODone 50 MG tablet  Commonly known as:  DESYREL  Take 50 mg by mouth at bedtime.     venlafaxine XR 150 MG 24 hr capsule  Commonly known as:  EFFEXOR-XR  Take 150 mg by mouth daily with breakfast.     Vitamin D (Ergocalciferol) 50000 UNITS Caps capsule  Commonly known as:  DRISDOL  Take 50,000 Units by mouth every 7 (seven) days.     warfarin 5 MG tablet  Commonly known as:  COUMADIN  Take 5 mg by mouth daily.     zolpidem 10 MG tablet  Commonly known as:  AMBIEN  Take 10 mg by mouth at bedtime as needed for sleep.        Disposition: home   Final Dx: ACDF      Discharge Instructions     Remove dressing in 72 hours    Complete by:  As directed      Call MD for:  difficulty breathing, headache or visual disturbances    Complete by:  As directed      Call MD for:  persistant nausea and vomiting    Complete by:  As directed      Call MD for:  redness, tenderness, or signs of infection (pain, swelling, redness, odor or green/yellow discharge around incision site)    Complete by:  As directed      Call MD for:  severe uncontrolled pain    Complete by:  As directed      Call MD for:  temperature  >100.4  Complete by:  As directed      Diet - low sodium heart healthy    Complete by:  As directed      Discharge instructions    Complete by:  As directed   Resume coumadin in 5 days, no heavy lifting or driving, may shower     Increase activity slowly    Complete by:  As directed               Signed: Martina Brodbeck S 10/05/2015, 10:56 AM

## 2015-12-15 IMAGING — CR DG CERVICAL SPINE 2 OR 3 VIEWS
1 series · 1 of 1 positions shown · non-contrast
Comparison: None.

CLINICAL DATA: Cervical spine surgery.

EXAM:
CERVICAL SPINE  4+ VIEWS

[AP]
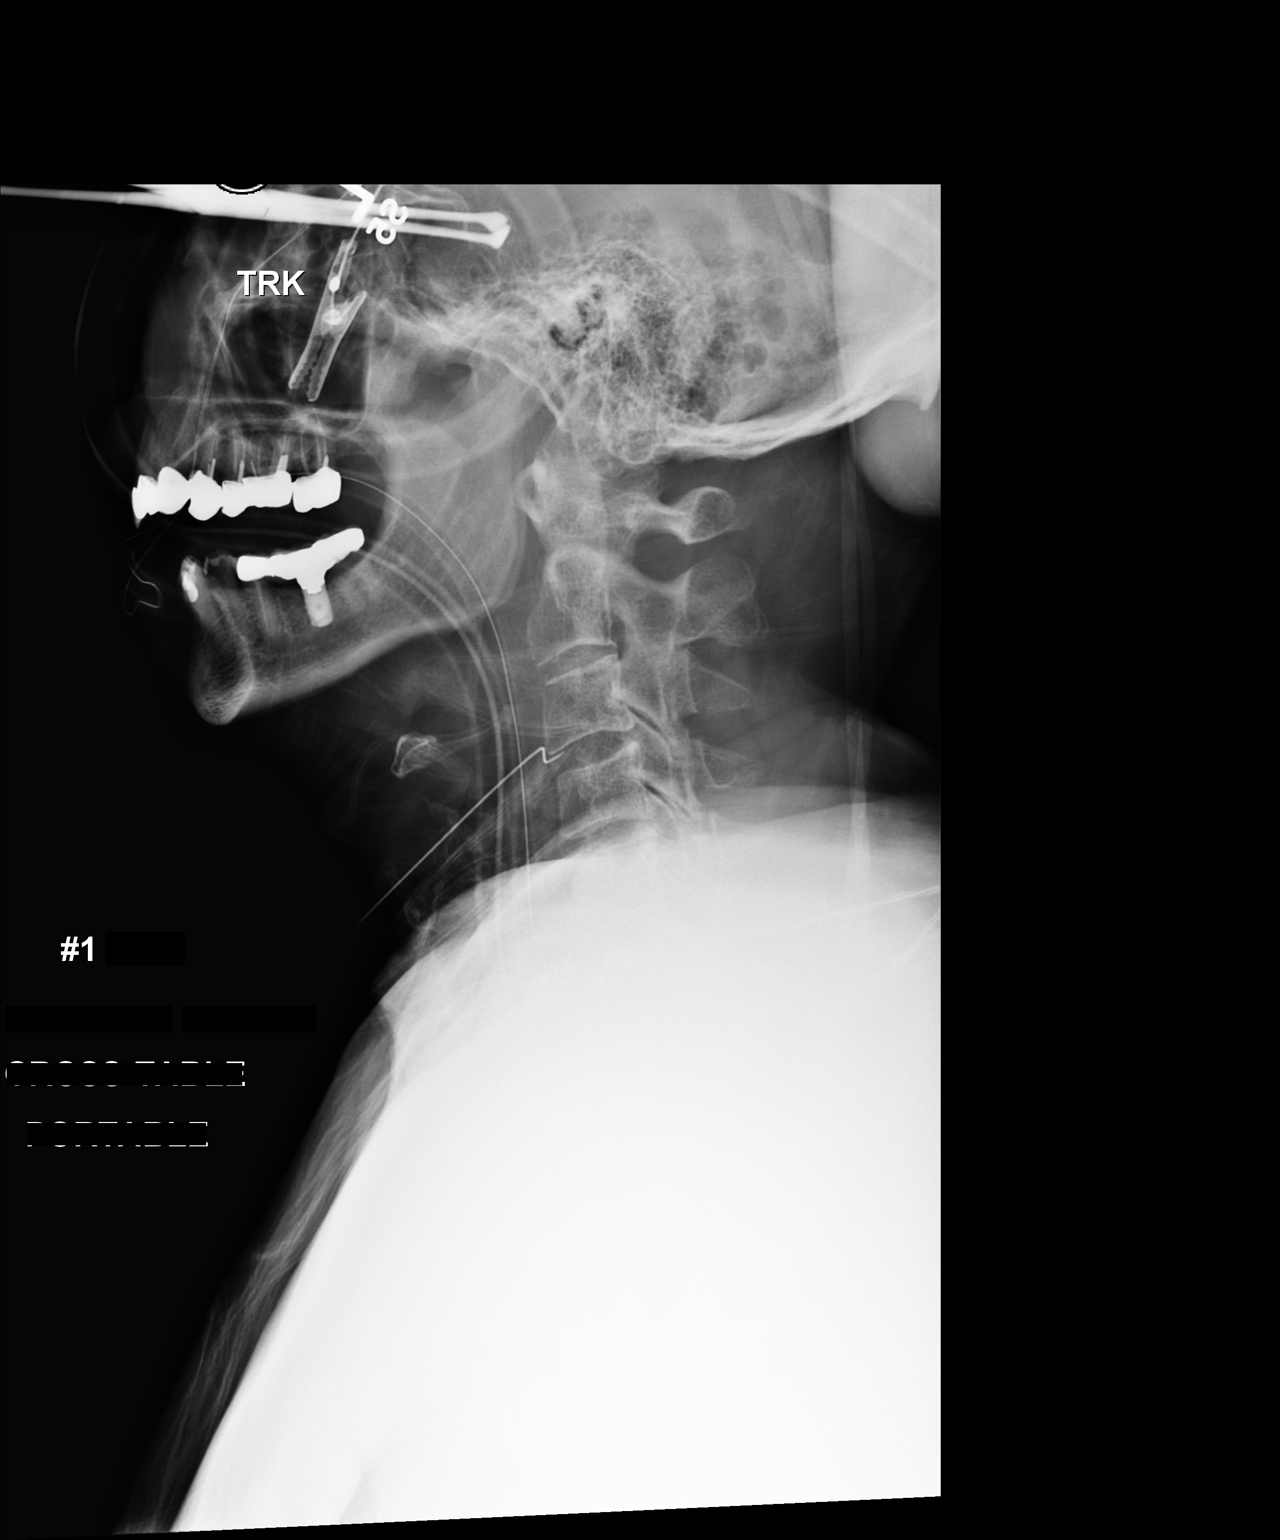

[1 of 1 positions shown; findings below may reference images not displayed]

FINDINGS: Metallic markers noted anteriorly at the C3-C4 disc space level.
Normal alignment. No acute bony abnormality identified .
IMPRESSION: Metallic marker noted anteriorly at the C3-C4 disc space level .

## 2015-12-19 DIAGNOSIS — M5412 Radiculopathy, cervical region: Secondary | ICD-10-CM | POA: Diagnosis not present

## 2015-12-19 DIAGNOSIS — M542 Cervicalgia: Secondary | ICD-10-CM | POA: Diagnosis not present

## 2015-12-19 DIAGNOSIS — M50222 Other cervical disc displacement at C5-C6 level: Secondary | ICD-10-CM | POA: Diagnosis not present

## 2015-12-19 DIAGNOSIS — M5022 Other cervical disc displacement, mid-cervical region, unspecified level: Secondary | ICD-10-CM | POA: Diagnosis not present

## 2015-12-19 DIAGNOSIS — M47812 Spondylosis without myelopathy or radiculopathy, cervical region: Secondary | ICD-10-CM | POA: Diagnosis not present

## 2015-12-21 DIAGNOSIS — H5211 Myopia, right eye: Secondary | ICD-10-CM | POA: Diagnosis not present

## 2015-12-21 DIAGNOSIS — H5202 Hypermetropia, left eye: Secondary | ICD-10-CM | POA: Diagnosis not present

## 2015-12-24 MED FILL — ALPHAGAN P 0.1% DROPS: 0.1 | 90 days supply | Qty: 30 | Fill #1

## 2015-12-24 MED FILL — clomiPRAMINE HCL 75 MG CAPS: 75 | 90 days supply | Qty: 90 | Fill #2

## 2015-12-24 MED FILL — PROCTOSOL-HC 2.5% CREAM: 2.5 | 15 days supply | Qty: 28 | Fill #2

## 2015-12-24 MED FILL — traZODone HCL 50 MG TABS: 50 | 90 days supply | Qty: 90 | Fill #3

## 2016-01-01 MED FILL — AMOXICILLIN 500 MG CAPSULE: 500 | 6 days supply | Qty: 24 | Fill #0

## 2016-01-07 MED FILL — VENLAFAXINE HCL ER 150 MG C: 150 | 90 days supply | Qty: 90 | Fill #3

## 2016-01-25 DIAGNOSIS — I82509 Chronic embolism and thrombosis of unspecified deep veins of unspecified lower extremity: Secondary | ICD-10-CM | POA: Diagnosis not present

## 2016-01-25 DIAGNOSIS — Z7901 Long term (current) use of anticoagulants: Secondary | ICD-10-CM | POA: Diagnosis not present

## 2016-02-06 DIAGNOSIS — H401133 Primary open-angle glaucoma, bilateral, severe stage: Secondary | ICD-10-CM | POA: Diagnosis not present

## 2016-02-11 MED FILL — CIALIS 5 MG TABLET: 5 | 90 days supply | Qty: 90 | Fill #3

## 2016-02-11 MED FILL — LISINOPRIL 20 MG TABLET: 20 | 90 days supply | Qty: 180 | Fill #2

## 2016-02-11 MED FILL — VIT D2 1.25 MG (50,000 UNIT: 1.25 MG | 84 days supply | Qty: 24 | Fill #2

## 2016-02-11 MED FILL — AMLODIPINE BESYLATE 5 MG TA: 5 | 90 days supply | Qty: 90 | Fill #2

## 2016-02-11 MED FILL — ROSUVASTATIN CALCIUM 20 MG: 20 | 90 days supply | Qty: 90 | Fill #3

## 2016-02-21 DIAGNOSIS — G459 Transient cerebral ischemic attack, unspecified: Secondary | ICD-10-CM | POA: Diagnosis not present

## 2016-02-21 DIAGNOSIS — Z7901 Long term (current) use of anticoagulants: Secondary | ICD-10-CM | POA: Diagnosis not present

## 2016-02-21 DIAGNOSIS — I82509 Chronic embolism and thrombosis of unspecified deep veins of unspecified lower extremity: Secondary | ICD-10-CM | POA: Diagnosis not present

## 2016-03-05 DIAGNOSIS — L821 Other seborrheic keratosis: Secondary | ICD-10-CM | POA: Diagnosis not present

## 2016-03-05 DIAGNOSIS — Z1283 Encounter for screening for malignant neoplasm of skin: Secondary | ICD-10-CM | POA: Diagnosis not present

## 2016-03-05 DIAGNOSIS — L570 Actinic keratosis: Secondary | ICD-10-CM | POA: Diagnosis not present

## 2016-03-05 DIAGNOSIS — L820 Inflamed seborrheic keratosis: Secondary | ICD-10-CM | POA: Diagnosis not present

## 2016-03-05 DIAGNOSIS — X32XXXD Exposure to sunlight, subsequent encounter: Secondary | ICD-10-CM | POA: Diagnosis not present

## 2016-03-17 MED FILL — WARFARIN SODIUM 5 MG TABLET: 5 | 84 days supply | Qty: 90 | Fill #0

## 2016-03-17 MED FILL — CLOBETASOL 0.05% CREAM: 0.05 | 20 days supply | Qty: 60 | Fill #1

## 2016-03-17 MED FILL — traZODone HCL 50 MG TABS: 50 | 90 days supply | Qty: 90 | Fill #0

## 2016-03-17 MED FILL — ALPHAGAN P 0.1% DROPS: 0.1 | 90 days supply | Qty: 30 | Fill #2

## 2016-03-17 MED FILL — clomiPRAMINE HCL 75 MG CAPS: 75 | 90 days supply | Qty: 90 | Fill #3

## 2016-03-19 DIAGNOSIS — M47812 Spondylosis without myelopathy or radiculopathy, cervical region: Secondary | ICD-10-CM | POA: Diagnosis not present

## 2016-03-19 DIAGNOSIS — M50222 Other cervical disc displacement at C5-C6 level: Secondary | ICD-10-CM | POA: Diagnosis not present

## 2016-03-19 DIAGNOSIS — M5022 Other cervical disc displacement, mid-cervical region, unspecified level: Secondary | ICD-10-CM | POA: Diagnosis not present

## 2016-03-19 DIAGNOSIS — I10 Essential (primary) hypertension: Secondary | ICD-10-CM | POA: Diagnosis not present

## 2016-03-19 DIAGNOSIS — Z6827 Body mass index (BMI) 27.0-27.9, adult: Secondary | ICD-10-CM | POA: Diagnosis not present

## 2016-03-19 DIAGNOSIS — M5412 Radiculopathy, cervical region: Secondary | ICD-10-CM | POA: Diagnosis not present

## 2016-03-27 DIAGNOSIS — G459 Transient cerebral ischemic attack, unspecified: Secondary | ICD-10-CM | POA: Diagnosis not present

## 2016-03-27 DIAGNOSIS — Z7901 Long term (current) use of anticoagulants: Secondary | ICD-10-CM | POA: Diagnosis not present

## 2016-03-27 DIAGNOSIS — I82509 Chronic embolism and thrombosis of unspecified deep veins of unspecified lower extremity: Secondary | ICD-10-CM | POA: Diagnosis not present

## 2016-04-02 MED FILL — ZOLPIDEM TARTRATE 10 MG TAB: 10 | 60 days supply | Qty: 60 | Fill #0

## 2016-04-03 MED FILL — ALPRAZolam 0.5 MG TABS: 0.5 | 30 days supply | Qty: 60 | Fill #0

## 2016-04-03 MED FILL — FLURAZEPAM 15 MG CAPSULE: 15 | 30 days supply | Qty: 60 | Fill #0

## 2016-04-03 MED FILL — VENLAFAXINE HCL ER 150 MG C: 150 | 90 days supply | Qty: 90 | Fill #0

## 2016-04-30 DIAGNOSIS — I82509 Chronic embolism and thrombosis of unspecified deep veins of unspecified lower extremity: Secondary | ICD-10-CM | POA: Diagnosis not present

## 2016-04-30 DIAGNOSIS — Z7901 Long term (current) use of anticoagulants: Secondary | ICD-10-CM | POA: Diagnosis not present

## 2016-05-05 MED FILL — ROSUVASTATIN CALCIUM 20 MG: 20 | 90 days supply | Qty: 90 | Fill #0

## 2016-05-05 MED FILL — VIT D2 1.25 MG (50,000 UNIT: 1.25 MG | 84 days supply | Qty: 24 | Fill #0

## 2016-05-05 MED FILL — AMLODIPINE BESYLATE 5 MG TA: 5 | 90 days supply | Qty: 90 | Fill #0

## 2016-05-05 MED FILL — LISINOPRIL 20 MG TABLET: 20 | 90 days supply | Qty: 180 | Fill #0

## 2016-05-05 MED FILL — TRIAMCINOLONE 0.1% CREAM: 0.1 | 30 days supply | Qty: 454 | Fill #1

## 2016-05-05 MED FILL — CIALIS 5 MG TABLET: 5 | 30 days supply | Qty: 30 | Fill #0

## 2016-05-14 DIAGNOSIS — L258 Unspecified contact dermatitis due to other agents: Secondary | ICD-10-CM | POA: Diagnosis not present

## 2016-05-14 DIAGNOSIS — R208 Other disturbances of skin sensation: Secondary | ICD-10-CM | POA: Diagnosis not present

## 2016-05-20 DIAGNOSIS — E119 Type 2 diabetes mellitus without complications: Secondary | ICD-10-CM | POA: Diagnosis not present

## 2016-05-20 DIAGNOSIS — E559 Vitamin D deficiency, unspecified: Secondary | ICD-10-CM | POA: Diagnosis not present

## 2016-05-20 DIAGNOSIS — Z1389 Encounter for screening for other disorder: Secondary | ICD-10-CM | POA: Diagnosis not present

## 2016-05-20 DIAGNOSIS — I82509 Chronic embolism and thrombosis of unspecified deep veins of unspecified lower extremity: Secondary | ICD-10-CM | POA: Diagnosis not present

## 2016-05-20 DIAGNOSIS — E298 Other testicular dysfunction: Secondary | ICD-10-CM | POA: Diagnosis not present

## 2016-05-20 DIAGNOSIS — I10 Essential (primary) hypertension: Secondary | ICD-10-CM | POA: Diagnosis not present

## 2016-05-20 DIAGNOSIS — M859 Disorder of bone density and structure, unspecified: Secondary | ICD-10-CM | POA: Diagnosis not present

## 2016-05-20 DIAGNOSIS — Z6828 Body mass index (BMI) 28.0-28.9, adult: Secondary | ICD-10-CM | POA: Diagnosis not present

## 2016-05-20 DIAGNOSIS — M810 Age-related osteoporosis without current pathological fracture: Secondary | ICD-10-CM | POA: Diagnosis not present

## 2016-05-20 MED FILL — OMEPRAZOLE DR 20 MG CAPSULE: 20 | 90 days supply | Qty: 180 | Fill #0

## 2016-05-21 MED FILL — IBANDRONATE NA 150 MG TAB: 150 | 84 days supply | Qty: 3 | Fill #0

## 2016-06-02 DIAGNOSIS — H401133 Primary open-angle glaucoma, bilateral, severe stage: Secondary | ICD-10-CM | POA: Diagnosis not present

## 2016-06-10 DIAGNOSIS — Z7901 Long term (current) use of anticoagulants: Secondary | ICD-10-CM | POA: Diagnosis not present

## 2016-06-10 DIAGNOSIS — I82509 Chronic embolism and thrombosis of unspecified deep veins of unspecified lower extremity: Secondary | ICD-10-CM | POA: Diagnosis not present

## 2016-06-12 MED FILL — clomiPRAMINE HCL 75 MG CAPS: 75 | 90 days supply | Qty: 90 | Fill #0

## 2016-06-23 MED FILL — CIALIS 5 MG TABLET: 5 | 90 days supply | Qty: 90 | Fill #1

## 2016-06-23 MED FILL — traZODone HCL 50 MG TABS: 50 | 90 days supply | Qty: 90 | Fill #1

## 2016-06-23 MED FILL — VENLAFAXINE HCL ER 150 MG C: 150 | 90 days supply | Qty: 90 | Fill #1

## 2016-06-23 MED FILL — WARFARIN SODIUM 5 MG TABLET: 5 | 84 days supply | Qty: 90 | Fill #1

## 2016-07-10 DIAGNOSIS — G459 Transient cerebral ischemic attack, unspecified: Secondary | ICD-10-CM | POA: Diagnosis not present

## 2016-07-10 DIAGNOSIS — I82509 Chronic embolism and thrombosis of unspecified deep veins of unspecified lower extremity: Secondary | ICD-10-CM | POA: Diagnosis not present

## 2016-07-10 DIAGNOSIS — Z7901 Long term (current) use of anticoagulants: Secondary | ICD-10-CM | POA: Diagnosis not present

## 2016-07-14 MED FILL — TRAVATAN Z 0.004% EYE DROP: 0.004 | 90 days supply | Qty: 10 | Fill #1

## 2016-07-14 MED FILL — ERYTHROMYCIN 2% SOLUTION: 2 | 30 days supply | Qty: 60 | Fill #0

## 2016-07-14 MED FILL — ALPHAGAN P 0.1% DROPS: 0.1 | 90 days supply | Qty: 30 | Fill #3

## 2016-07-14 MED FILL — ALPRAZolam 0.5 MG TABS: 0.5 | 30 days supply | Qty: 60 | Fill #1

## 2016-07-14 MED FILL — TIMOLOL 0.5% EYE DROPS: 0.5 | 90 days supply | Qty: 20 | Fill #1

## 2016-07-23 DIAGNOSIS — B078 Other viral warts: Secondary | ICD-10-CM | POA: Diagnosis not present

## 2016-07-23 DIAGNOSIS — L820 Inflamed seborrheic keratosis: Secondary | ICD-10-CM | POA: Diagnosis not present

## 2016-07-23 DIAGNOSIS — B353 Tinea pedis: Secondary | ICD-10-CM | POA: Diagnosis not present

## 2016-07-29 MED FILL — VIT D2 1.25 MG (50,000 UNIT: 1.25 MG | 84 days supply | Qty: 24 | Fill #1

## 2016-07-30 MED FILL — FLURAZEPAM 15 MG CAPSULE: 15 | 30 days supply | Qty: 60 | Fill #0

## 2016-07-30 MED FILL — AMOXICILLIN 500 MG CAPSULE: 500 | 6 days supply | Qty: 24 | Fill #0

## 2016-08-11 MED FILL — AMLODIPINE BESYLATE 5 MG TA: 5 | 90 days supply | Qty: 90 | Fill #1

## 2016-08-11 MED FILL — LISINOPRIL 20 MG TABLET: 20 | 90 days supply | Qty: 180 | Fill #1

## 2016-08-11 MED FILL — OMEPRAZOLE DR 20 MG CAPSULE: 20 | 90 days supply | Qty: 180 | Fill #1

## 2016-08-11 MED FILL — ROSUVASTATIN CALCIUM 20 MG: 20 | 90 days supply | Qty: 90 | Fill #1

## 2016-08-13 DIAGNOSIS — I82509 Chronic embolism and thrombosis of unspecified deep veins of unspecified lower extremity: Secondary | ICD-10-CM | POA: Diagnosis not present

## 2016-08-13 DIAGNOSIS — Z7901 Long term (current) use of anticoagulants: Secondary | ICD-10-CM | POA: Diagnosis not present

## 2016-08-22 DIAGNOSIS — H401133 Primary open-angle glaucoma, bilateral, severe stage: Secondary | ICD-10-CM | POA: Diagnosis not present

## 2016-09-01 MED FILL — clomiPRAMINE HCL 75 MG CAPS: 75 | 90 days supply | Qty: 90 | Fill #1

## 2016-09-17 DIAGNOSIS — Z7901 Long term (current) use of anticoagulants: Secondary | ICD-10-CM | POA: Diagnosis not present

## 2016-09-17 DIAGNOSIS — G459 Transient cerebral ischemic attack, unspecified: Secondary | ICD-10-CM | POA: Diagnosis not present

## 2016-09-17 DIAGNOSIS — Z23 Encounter for immunization: Secondary | ICD-10-CM | POA: Diagnosis not present

## 2016-09-17 DIAGNOSIS — I82509 Chronic embolism and thrombosis of unspecified deep veins of unspecified lower extremity: Secondary | ICD-10-CM | POA: Diagnosis not present

## 2016-09-22 MED FILL — traZODone HCL 50 MG TABS: 50 | 90 days supply | Qty: 90 | Fill #2

## 2016-09-22 MED FILL — CIALIS 5 MG TABLET: 5 | 90 days supply | Qty: 90 | Fill #2

## 2016-09-22 MED FILL — ALPRAZolam 0.5 MG TABS: 0.5 | 30 days supply | Qty: 60 | Fill #2

## 2016-09-22 MED FILL — ZOLPIDEM TARTRATE 10 MG TAB: 10 | 60 days supply | Qty: 60 | Fill #1

## 2016-09-22 MED FILL — VENLAFAXINE HCL ER 150 MG C: 150 | 90 days supply | Qty: 90 | Fill #2

## 2016-09-22 MED FILL — FLURAZEPAM 30 MG CAPSULE: 30 | 30 days supply | Qty: 30 | Fill #0

## 2016-09-22 MED FILL — WARFARIN SODIUM 5 MG TABLET: 5 | 84 days supply | Qty: 90 | Fill #2

## 2016-09-29 DIAGNOSIS — H401133 Primary open-angle glaucoma, bilateral, severe stage: Secondary | ICD-10-CM | POA: Diagnosis not present

## 2016-09-29 MED FILL — ALPHAGAN P 0.1% DROPS: 0.1 | 90 days supply | Qty: 30 | Fill #0

## 2016-09-29 MED FILL — TIMOLOL 0.5% EYE DROPS: 0.5 | 90 days supply | Qty: 20 | Fill #0

## 2016-09-29 MED FILL — TRAVATAN Z 0.004% EYE DROP: 0.004 | 90 days supply | Qty: 10 | Fill #0

## 2016-10-23 DIAGNOSIS — Z7901 Long term (current) use of anticoagulants: Secondary | ICD-10-CM | POA: Diagnosis not present

## 2016-10-23 DIAGNOSIS — I82509 Chronic embolism and thrombosis of unspecified deep veins of unspecified lower extremity: Secondary | ICD-10-CM | POA: Diagnosis not present

## 2016-11-10 MED FILL — VIT D2 1.25 MG (50,000 UNIT: 1.25 MG | 84 days supply | Qty: 24 | Fill #2

## 2016-11-10 MED FILL — OMEPRAZOLE DR 20 MG CAPSULE: 20 | 90 days supply | Qty: 180 | Fill #2

## 2016-11-10 MED FILL — IBANDRONATE NA 150 MG TAB: 150 | 84 days supply | Qty: 3 | Fill #1

## 2016-11-10 MED FILL — LISINOPRIL 20 MG TABLET: 20 | 90 days supply | Qty: 180 | Fill #2

## 2016-11-10 MED FILL — ROSUVASTATIN CALCIUM 20 MG: 20 | 90 days supply | Qty: 90 | Fill #2

## 2016-11-10 MED FILL — AMLODIPINE BESYLATE 5 MG TA: 5 | 90 days supply | Qty: 90 | Fill #2

## 2016-11-26 DIAGNOSIS — I82509 Chronic embolism and thrombosis of unspecified deep veins of unspecified lower extremity: Secondary | ICD-10-CM | POA: Diagnosis not present

## 2016-11-26 DIAGNOSIS — Z7901 Long term (current) use of anticoagulants: Secondary | ICD-10-CM | POA: Diagnosis not present

## 2016-12-01 MED FILL — clomiPRAMINE HCL 75 MG CAPS: 75 | 90 days supply | Qty: 90 | Fill #2

## 2016-12-16 MED FILL — traZODone HCL 50 MG TABS: 50 | 90 days supply | Qty: 90 | Fill #3

## 2016-12-16 MED FILL — WARFARIN SODIUM 5 MG TABLET: 5 | 84 days supply | Qty: 90 | Fill #3

## 2016-12-16 MED FILL — VENLAFAXINE HCL ER 150 MG C: 150 | 90 days supply | Qty: 90 | Fill #3

## 2016-12-16 MED FILL — CIALIS 5 MG TABLET: 5 | 30 days supply | Qty: 30 | Fill #3

## 2016-12-25 DIAGNOSIS — I82509 Chronic embolism and thrombosis of unspecified deep veins of unspecified lower extremity: Secondary | ICD-10-CM | POA: Diagnosis not present

## 2016-12-25 DIAGNOSIS — Z7901 Long term (current) use of anticoagulants: Secondary | ICD-10-CM | POA: Diagnosis not present

## 2016-12-25 DIAGNOSIS — G459 Transient cerebral ischemic attack, unspecified: Secondary | ICD-10-CM | POA: Diagnosis not present

## 2017-01-16 DIAGNOSIS — Z7901 Long term (current) use of anticoagulants: Secondary | ICD-10-CM | POA: Diagnosis not present

## 2017-01-16 DIAGNOSIS — Z125 Encounter for screening for malignant neoplasm of prostate: Secondary | ICD-10-CM | POA: Diagnosis not present

## 2017-01-16 DIAGNOSIS — E559 Vitamin D deficiency, unspecified: Secondary | ICD-10-CM | POA: Diagnosis not present

## 2017-01-16 DIAGNOSIS — E119 Type 2 diabetes mellitus without complications: Secondary | ICD-10-CM | POA: Diagnosis not present

## 2017-01-16 DIAGNOSIS — Z0000 Encounter for general adult medical examination without abnormal findings: Secondary | ICD-10-CM | POA: Diagnosis not present

## 2017-01-19 DIAGNOSIS — H401133 Primary open-angle glaucoma, bilateral, severe stage: Secondary | ICD-10-CM | POA: Diagnosis not present

## 2017-01-28 DIAGNOSIS — M25569 Pain in unspecified knee: Secondary | ICD-10-CM | POA: Diagnosis not present

## 2017-01-28 DIAGNOSIS — G458 Other transient cerebral ischemic attacks and related syndromes: Secondary | ICD-10-CM | POA: Diagnosis not present

## 2017-01-28 DIAGNOSIS — M810 Age-related osteoporosis without current pathological fracture: Secondary | ICD-10-CM | POA: Diagnosis not present

## 2017-01-28 DIAGNOSIS — E663 Overweight: Secondary | ICD-10-CM | POA: Diagnosis not present

## 2017-01-28 DIAGNOSIS — Z0000 Encounter for general adult medical examination without abnormal findings: Secondary | ICD-10-CM | POA: Diagnosis not present

## 2017-01-28 DIAGNOSIS — Z1389 Encounter for screening for other disorder: Secondary | ICD-10-CM | POA: Diagnosis not present

## 2017-01-28 DIAGNOSIS — I82509 Chronic embolism and thrombosis of unspecified deep veins of unspecified lower extremity: Secondary | ICD-10-CM | POA: Diagnosis not present

## 2017-01-28 DIAGNOSIS — N528 Other male erectile dysfunction: Secondary | ICD-10-CM | POA: Diagnosis not present

## 2017-01-28 DIAGNOSIS — E119 Type 2 diabetes mellitus without complications: Secondary | ICD-10-CM | POA: Diagnosis not present

## 2017-01-28 DIAGNOSIS — E559 Vitamin D deficiency, unspecified: Secondary | ICD-10-CM | POA: Diagnosis not present

## 2017-01-28 MED FILL — IBANDRONATE NA 150 MG TAB: 150 | 84 days supply | Qty: 3 | Fill #0

## 2017-01-28 MED FILL — ROSUVASTATIN CALCIUM 20 MG: 20 | 90 days supply | Qty: 90 | Fill #0

## 2017-01-28 MED FILL — CIALIS 5 MG TABLET: 5 | 30 days supply | Qty: 30 | Fill #0

## 2017-01-28 MED FILL — NAFTIN 2% GEL: 2 | 30 days supply | Qty: 45 | Fill #0

## 2017-01-28 MED FILL — VIT D2 1.25 MG (50,000 UNIT: 1.25 MG | 84 days supply | Qty: 24 | Fill #0

## 2017-01-28 MED FILL — AMLODIPINE BESYLATE 5 MG TA: 5 | 90 days supply | Qty: 90 | Fill #0

## 2017-01-28 MED FILL — LISINOPRIL 20 MG TABLET: 20 | 90 days supply | Qty: 180 | Fill #0

## 2017-02-03 MED FILL — AMOXICILLIN 500 MG CAPSULE: 500 | 6 days supply | Qty: 24 | Fill #1

## 2017-02-19 DIAGNOSIS — Z96641 Presence of right artificial hip joint: Secondary | ICD-10-CM | POA: Diagnosis not present

## 2017-02-19 DIAGNOSIS — Z471 Aftercare following joint replacement surgery: Secondary | ICD-10-CM | POA: Diagnosis not present

## 2017-02-19 DIAGNOSIS — M1712 Unilateral primary osteoarthritis, left knee: Secondary | ICD-10-CM | POA: Diagnosis not present

## 2017-02-19 DIAGNOSIS — M1711 Unilateral primary osteoarthritis, right knee: Secondary | ICD-10-CM | POA: Diagnosis not present

## 2017-02-23 MED FILL — CIALIS 5 MG TABLET: 5 | 30 days supply | Qty: 30 | Fill #1

## 2017-02-23 MED FILL — ERYTHROMYCIN 2% SOLUTION: 2 | 30 days supply | Qty: 60 | Fill #1

## 2017-02-23 MED FILL — clomiPRAMINE HCL 75 MG CAPS: 75 | 90 days supply | Qty: 90 | Fill #3

## 2017-02-23 MED FILL — OMEPRAZOLE DR 20 MG CAPSULE: 20 | 90 days supply | Qty: 180 | Fill #3

## 2017-02-27 MED FILL — WARFARIN SODIUM 5 MG TABLET: 5 | 90 days supply | Qty: 102 | Fill #0

## 2017-03-10 DIAGNOSIS — Z7901 Long term (current) use of anticoagulants: Secondary | ICD-10-CM | POA: Diagnosis not present

## 2017-03-10 DIAGNOSIS — I82509 Chronic embolism and thrombosis of unspecified deep veins of unspecified lower extremity: Secondary | ICD-10-CM | POA: Diagnosis not present

## 2017-03-12 DIAGNOSIS — M65312 Trigger thumb, left thumb: Secondary | ICD-10-CM | POA: Diagnosis not present

## 2017-03-18 DIAGNOSIS — H401133 Primary open-angle glaucoma, bilateral, severe stage: Secondary | ICD-10-CM | POA: Diagnosis not present

## 2017-03-19 DIAGNOSIS — H401133 Primary open-angle glaucoma, bilateral, severe stage: Secondary | ICD-10-CM | POA: Insufficient documentation

## 2017-03-23 MED FILL — ALPHAGAN P 0.1% DROPS: 0.1 | 90 days supply | Qty: 30 | Fill #1

## 2017-03-23 MED FILL — CIALIS 5 MG TABLET: 5 | 30 days supply | Qty: 30 | Fill #2

## 2017-03-23 MED FILL — VENLAFAXINE HCL ER 150 MG C: 150 | 90 days supply | Qty: 90 | Fill #0

## 2017-03-23 MED FILL — TIMOLOL 0.5% EYE DROPS: 0.5 | 90 days supply | Qty: 20 | Fill #1

## 2017-03-23 MED FILL — traZODone HCL 50 MG TABS: 50 | 90 days supply | Qty: 90 | Fill #0

## 2017-03-23 MED FILL — TRAVATAN Z 0.004% EYE DROP: 0.004 | 60 days supply | Qty: 5 | Fill #1

## 2017-04-01 DIAGNOSIS — L820 Inflamed seborrheic keratosis: Secondary | ICD-10-CM | POA: Diagnosis not present

## 2017-04-01 DIAGNOSIS — Z1283 Encounter for screening for malignant neoplasm of skin: Secondary | ICD-10-CM | POA: Diagnosis not present

## 2017-04-01 DIAGNOSIS — B353 Tinea pedis: Secondary | ICD-10-CM | POA: Diagnosis not present

## 2017-04-08 DIAGNOSIS — M65312 Trigger thumb, left thumb: Secondary | ICD-10-CM | POA: Diagnosis not present

## 2017-04-21 MED FILL — CIALIS 5 MG TABLET: 5 | 30 days supply | Qty: 30 | Fill #3

## 2017-04-21 MED FILL — ALPRAZolam 0.5 MG TABS: 0.5 | 30 days supply | Qty: 60 | Fill #0

## 2017-04-21 MED FILL — AMLODIPINE BESYLATE 5 MG TA: 5 | 90 days supply | Qty: 90 | Fill #1

## 2017-04-21 MED FILL — ROSUVASTATIN CALCIUM 20 MG: 20 | 90 days supply | Qty: 90 | Fill #1

## 2017-04-22 DIAGNOSIS — Z7901 Long term (current) use of anticoagulants: Secondary | ICD-10-CM | POA: Diagnosis not present

## 2017-04-22 DIAGNOSIS — I82509 Chronic embolism and thrombosis of unspecified deep veins of unspecified lower extremity: Secondary | ICD-10-CM | POA: Diagnosis not present

## 2017-04-28 DIAGNOSIS — H5202 Hypermetropia, left eye: Secondary | ICD-10-CM | POA: Diagnosis not present

## 2017-05-12 MED FILL — MUPIROCIN 2% OINTMENT: 2 | 7 days supply | Qty: 22 | Fill #0

## 2017-05-13 DIAGNOSIS — M79641 Pain in right hand: Secondary | ICD-10-CM | POA: Diagnosis not present

## 2017-05-13 DIAGNOSIS — M65312 Trigger thumb, left thumb: Secondary | ICD-10-CM | POA: Diagnosis not present

## 2017-05-13 DIAGNOSIS — M65311 Trigger thumb, right thumb: Secondary | ICD-10-CM | POA: Diagnosis not present

## 2017-05-13 DIAGNOSIS — M79642 Pain in left hand: Secondary | ICD-10-CM | POA: Diagnosis not present

## 2017-05-14 MED FILL — VIT D2 1.25 MG (50,000 UNIT: 1.25 MG | 84 days supply | Qty: 24 | Fill #1

## 2017-05-18 MED FILL — clomiPRAMINE HCL 75 MG CAPS: 75 | 90 days supply | Qty: 90 | Fill #4

## 2017-05-18 MED FILL — LISINOPRIL 20 MG TABLET: 20 | 90 days supply | Qty: 180 | Fill #1

## 2017-05-18 MED FILL — OMEPRAZOLE DR 20 MG CAPSULE: 20 | 90 days supply | Qty: 180 | Fill #4

## 2017-05-18 MED FILL — TRAVATAN Z 0.004% EYE DROP: 0.004 | 60 days supply | Qty: 5 | Fill #2

## 2017-05-18 MED FILL — WARFARIN SODIUM 5 MG TABLET: 5 | 90 days supply | Qty: 102 | Fill #1

## 2017-05-18 MED FILL — CIALIS 5 MG TABLET: 5 | 30 days supply | Qty: 30 | Fill #4

## 2017-05-18 MED FILL — NAFTIN 2 % GEL: 2 | 30 days supply | Qty: 45 | Fill #1

## 2017-05-18 MED FILL — ERYTHROMYCIN 2% SOLUTION: 2 | 30 days supply | Qty: 60 | Fill #2

## 2017-05-27 DIAGNOSIS — I82509 Chronic embolism and thrombosis of unspecified deep veins of unspecified lower extremity: Secondary | ICD-10-CM | POA: Diagnosis not present

## 2017-05-27 DIAGNOSIS — Z7901 Long term (current) use of anticoagulants: Secondary | ICD-10-CM | POA: Diagnosis not present

## 2017-05-28 DIAGNOSIS — M170 Bilateral primary osteoarthritis of knee: Secondary | ICD-10-CM | POA: Diagnosis not present

## 2017-06-05 DIAGNOSIS — M170 Bilateral primary osteoarthritis of knee: Secondary | ICD-10-CM | POA: Diagnosis not present

## 2017-06-11 DIAGNOSIS — M170 Bilateral primary osteoarthritis of knee: Secondary | ICD-10-CM | POA: Diagnosis not present

## 2017-06-18 DIAGNOSIS — M5412 Radiculopathy, cervical region: Secondary | ICD-10-CM | POA: Diagnosis not present

## 2017-06-18 DIAGNOSIS — M5022 Other cervical disc displacement, mid-cervical region, unspecified level: Secondary | ICD-10-CM | POA: Diagnosis not present

## 2017-06-18 DIAGNOSIS — M542 Cervicalgia: Secondary | ICD-10-CM | POA: Diagnosis not present

## 2017-06-18 DIAGNOSIS — M47812 Spondylosis without myelopathy or radiculopathy, cervical region: Secondary | ICD-10-CM | POA: Diagnosis not present

## 2017-06-22 ENCOUNTER — Other Ambulatory Visit (HOSPITAL_COMMUNITY): Payer: Self-pay | Admitting: Neurosurgery

## 2017-06-22 DIAGNOSIS — M5022 Other cervical disc displacement, mid-cervical region, unspecified level: Secondary | ICD-10-CM

## 2017-06-22 MED FILL — IBANDRONATE NA 150 MG TAB: 150 | 84 days supply | Qty: 3 | Fill #1

## 2017-06-22 MED FILL — traZODone HCL 50 MG TABS: 50 | 90 days supply | Qty: 90 | Fill #1

## 2017-06-22 MED FILL — VENLAFAXINE HCL ER 150 MG C: 150 | 90 days supply | Qty: 90 | Fill #1

## 2017-06-22 MED FILL — CIALIS 5 MG TABLET: 5 | 30 days supply | Qty: 30 | Fill #5

## 2017-06-24 DIAGNOSIS — M542 Cervicalgia: Secondary | ICD-10-CM | POA: Diagnosis not present

## 2017-06-24 DIAGNOSIS — M65312 Trigger thumb, left thumb: Secondary | ICD-10-CM | POA: Diagnosis not present

## 2017-06-24 DIAGNOSIS — M65311 Trigger thumb, right thumb: Secondary | ICD-10-CM | POA: Diagnosis not present

## 2017-06-26 ENCOUNTER — Ambulatory Visit (HOSPITAL_COMMUNITY): Admission: RE | Admit: 2017-06-26 | Payer: 59 | Source: Ambulatory Visit

## 2017-06-30 ENCOUNTER — Ambulatory Visit (HOSPITAL_COMMUNITY)
Admission: RE | Admit: 2017-06-30 | Discharge: 2017-06-30 | Disposition: A | Discharge status: Home / Self Care | Payer: 59 | Source: Ambulatory Visit | Attending: Neurosurgery | Admitting: Neurosurgery

## 2017-06-30 DIAGNOSIS — M4802 Spinal stenosis, cervical region: Secondary | ICD-10-CM | POA: Diagnosis not present

## 2017-06-30 DIAGNOSIS — M542 Cervicalgia: Secondary | ICD-10-CM | POA: Diagnosis not present

## 2017-06-30 DIAGNOSIS — Z981 Arthrodesis status: Secondary | ICD-10-CM | POA: Diagnosis not present

## 2017-06-30 DIAGNOSIS — M5022 Other cervical disc displacement, mid-cervical region, unspecified level: Secondary | ICD-10-CM | POA: Diagnosis not present

## 2017-07-07 DIAGNOSIS — I82509 Chronic embolism and thrombosis of unspecified deep veins of unspecified lower extremity: Secondary | ICD-10-CM | POA: Diagnosis not present

## 2017-07-07 DIAGNOSIS — Z7901 Long term (current) use of anticoagulants: Secondary | ICD-10-CM | POA: Diagnosis not present

## 2017-07-09 DIAGNOSIS — M9981 Other biomechanical lesions of cervical region: Secondary | ICD-10-CM | POA: Diagnosis not present

## 2017-07-09 DIAGNOSIS — G5601 Carpal tunnel syndrome, right upper limb: Secondary | ICD-10-CM | POA: Diagnosis not present

## 2017-07-09 DIAGNOSIS — G5621 Lesion of ulnar nerve, right upper limb: Secondary | ICD-10-CM | POA: Diagnosis not present

## 2017-07-14 MED FILL — ALPRAZolam 0.5 MG TABS: 0.5 | 30 days supply | Qty: 60 | Fill #1

## 2017-07-16 MED FILL — CIALIS 5 MG TABLET: 5 | 30 days supply | Qty: 30 | Fill #6

## 2017-07-20 DIAGNOSIS — Z6828 Body mass index (BMI) 28.0-28.9, adult: Secondary | ICD-10-CM | POA: Diagnosis not present

## 2017-07-20 DIAGNOSIS — G5601 Carpal tunnel syndrome, right upper limb: Secondary | ICD-10-CM | POA: Diagnosis not present

## 2017-07-20 DIAGNOSIS — G5621 Lesion of ulnar nerve, right upper limb: Secondary | ICD-10-CM | POA: Diagnosis not present

## 2017-07-20 DIAGNOSIS — I10 Essential (primary) hypertension: Secondary | ICD-10-CM | POA: Diagnosis not present

## 2017-07-24 DIAGNOSIS — I10 Essential (primary) hypertension: Secondary | ICD-10-CM | POA: Diagnosis not present

## 2017-07-24 DIAGNOSIS — R42 Dizziness and giddiness: Secondary | ICD-10-CM | POA: Diagnosis not present

## 2017-07-24 DIAGNOSIS — F5104 Psychophysiologic insomnia: Secondary | ICD-10-CM | POA: Diagnosis not present

## 2017-07-24 DIAGNOSIS — Z6828 Body mass index (BMI) 28.0-28.9, adult: Secondary | ICD-10-CM | POA: Diagnosis not present

## 2017-07-24 DIAGNOSIS — I82509 Chronic embolism and thrombosis of unspecified deep veins of unspecified lower extremity: Secondary | ICD-10-CM | POA: Diagnosis not present

## 2017-07-24 DIAGNOSIS — E119 Type 2 diabetes mellitus without complications: Secondary | ICD-10-CM | POA: Diagnosis not present

## 2017-07-24 MED FILL — CIPRODEX OTIC SUSPENSION: 0.3-0.1 | 10 days supply | Qty: 8 | Fill #0

## 2017-07-24 MED FILL — FLURAZEPAM 30 MG CAPSULE: 30 | 30 days supply | Qty: 30 | Fill #0

## 2017-07-24 MED FILL — ZOLPIDEM TARTRATE 10 MG TAB: 10 | 60 days supply | Qty: 60 | Fill #0

## 2017-07-28 DIAGNOSIS — H2513 Age-related nuclear cataract, bilateral: Secondary | ICD-10-CM | POA: Diagnosis not present

## 2017-07-30 MED FILL — CIALIS 20 MG TABLET: 20 | 30 days supply | Qty: 6 | Fill #0

## 2017-08-03 ENCOUNTER — Other Ambulatory Visit: Payer: Self-pay | Admitting: Neurosurgery

## 2017-08-11 MED FILL — VIT D2 1.25 MG (50,000 UNIT: 1.25 MG | 84 days supply | Qty: 24 | Fill #2

## 2017-08-11 MED FILL — LISINOPRIL 20 MG TABS: 20 | 90 days supply | Qty: 180 | Fill #2

## 2017-08-11 MED FILL — OMEPRAZOLE 20 MG CAP: 20 | 90 days supply | Qty: 180 | Fill #0

## 2017-08-11 MED FILL — ROSUVASTATIN CALCIUM 20 MG: 20 | 90 days supply | Qty: 90 | Fill #2

## 2017-08-11 MED FILL — clomiPRAMINE HCL 75 MG CAPS: 75 | 90 days supply | Qty: 90 | Fill #0

## 2017-08-11 MED FILL — WARFARIN SODIUM 5 MG TABLET: 5 | 90 days supply | Qty: 102 | Fill #0

## 2017-08-11 MED FILL — AMLODIPINE BESYLATE 5 MG TA: 5 | 90 days supply | Qty: 90 | Fill #2

## 2017-08-12 DIAGNOSIS — G459 Transient cerebral ischemic attack, unspecified: Secondary | ICD-10-CM | POA: Diagnosis not present

## 2017-08-12 DIAGNOSIS — I82509 Chronic embolism and thrombosis of unspecified deep veins of unspecified lower extremity: Secondary | ICD-10-CM | POA: Diagnosis not present

## 2017-08-12 DIAGNOSIS — Z23 Encounter for immunization: Secondary | ICD-10-CM | POA: Diagnosis not present

## 2017-08-12 DIAGNOSIS — Z7901 Long term (current) use of anticoagulants: Secondary | ICD-10-CM | POA: Diagnosis not present

## 2017-08-21 MED FILL — CIALIS 5 MG TABLET: 5 | 30 days supply | Qty: 30 | Fill #0

## 2017-08-26 DIAGNOSIS — G5621 Lesion of ulnar nerve, right upper limb: Secondary | ICD-10-CM | POA: Diagnosis not present

## 2017-08-31 DIAGNOSIS — H43813 Vitreous degeneration, bilateral: Secondary | ICD-10-CM | POA: Diagnosis not present

## 2017-09-02 DIAGNOSIS — M65311 Trigger thumb, right thumb: Secondary | ICD-10-CM | POA: Diagnosis not present

## 2017-09-02 DIAGNOSIS — M542 Cervicalgia: Secondary | ICD-10-CM | POA: Diagnosis not present

## 2017-09-02 DIAGNOSIS — M65312 Trigger thumb, left thumb: Secondary | ICD-10-CM | POA: Diagnosis not present

## 2017-09-02 DIAGNOSIS — M79641 Pain in right hand: Secondary | ICD-10-CM | POA: Diagnosis not present

## 2017-09-10 DIAGNOSIS — Z7901 Long term (current) use of anticoagulants: Secondary | ICD-10-CM | POA: Diagnosis not present

## 2017-09-10 DIAGNOSIS — I82509 Chronic embolism and thrombosis of unspecified deep veins of unspecified lower extremity: Secondary | ICD-10-CM | POA: Diagnosis not present

## 2017-09-11 DIAGNOSIS — M1712 Unilateral primary osteoarthritis, left knee: Secondary | ICD-10-CM | POA: Diagnosis not present

## 2017-09-11 DIAGNOSIS — M1711 Unilateral primary osteoarthritis, right knee: Secondary | ICD-10-CM | POA: Diagnosis not present

## 2017-09-16 DIAGNOSIS — B353 Tinea pedis: Secondary | ICD-10-CM | POA: Diagnosis not present

## 2017-09-16 DIAGNOSIS — L820 Inflamed seborrheic keratosis: Secondary | ICD-10-CM | POA: Diagnosis not present

## 2017-09-16 DIAGNOSIS — Z1283 Encounter for screening for malignant neoplasm of skin: Secondary | ICD-10-CM | POA: Diagnosis not present

## 2017-09-16 DIAGNOSIS — L821 Other seborrheic keratosis: Secondary | ICD-10-CM | POA: Diagnosis not present

## 2017-09-17 MED FILL — traZODone HCL 50 MG TABS: 50 | 90 days supply | Qty: 90 | Fill #2

## 2017-09-17 MED FILL — VENLAFAXINE HCL ER 150 MG C: 150 | 90 days supply | Qty: 90 | Fill #2

## 2017-09-17 MED FILL — CIALIS 5 MG TABLET: 5 | 30 days supply | Qty: 30 | Fill #1

## 2017-09-17 MED FILL — ALPRAZolam 0.5 MG TABS: 0.5 | 30 days supply | Qty: 60 | Fill #2

## 2017-09-24 DIAGNOSIS — H43812 Vitreous degeneration, left eye: Secondary | ICD-10-CM | POA: Diagnosis not present

## 2017-09-29 ENCOUNTER — Ambulatory Visit: Admit: 2017-09-29 | Payer: 59 | Admitting: Neurosurgery

## 2017-09-29 SURGERY — ULNAR NERVE DECOMPRESSION/TRANSPOSITION
Anesthesia: General | Laterality: Right

## 2017-10-05 MED FILL — IBANDRONATE NA 150 MG TAB: 150 | 84 days supply | Qty: 3 | Fill #2

## 2017-10-09 MED FILL — TRAVATAN Z 0.004% EYE DROP: 0.004 | 60 days supply | Qty: 5 | Fill #0

## 2017-10-09 MED FILL — TIMOLOL 0.5% EYE DROPS: 0.5 | 50 days supply | Qty: 5 | Fill #0

## 2017-10-15 MED FILL — ALPHAGAN P 0.1% DROPS: 0.1 | 30 days supply | Qty: 10 | Fill #0

## 2017-10-20 DIAGNOSIS — R2689 Other abnormalities of gait and mobility: Secondary | ICD-10-CM | POA: Diagnosis not present

## 2017-10-20 DIAGNOSIS — K5909 Other constipation: Secondary | ICD-10-CM | POA: Diagnosis not present

## 2017-10-20 DIAGNOSIS — I10 Essential (primary) hypertension: Secondary | ICD-10-CM | POA: Diagnosis not present

## 2017-10-20 DIAGNOSIS — I82509 Chronic embolism and thrombosis of unspecified deep veins of unspecified lower extremity: Secondary | ICD-10-CM | POA: Diagnosis not present

## 2017-10-20 DIAGNOSIS — M810 Age-related osteoporosis without current pathological fracture: Secondary | ICD-10-CM | POA: Diagnosis not present

## 2017-10-20 DIAGNOSIS — Z6828 Body mass index (BMI) 28.0-28.9, adult: Secondary | ICD-10-CM | POA: Diagnosis not present

## 2017-10-20 DIAGNOSIS — E119 Type 2 diabetes mellitus without complications: Secondary | ICD-10-CM | POA: Diagnosis not present

## 2017-10-20 DIAGNOSIS — K219 Gastro-esophageal reflux disease without esophagitis: Secondary | ICD-10-CM | POA: Diagnosis not present

## 2017-10-22 MED FILL — TADALAFIL 5 MG TABS: 5 | 30 days supply | Qty: 30 | Fill #2

## 2017-11-09 MED FILL — clomiPRAMINE HCL 75 MG CAPS: 75 | 90 days supply | Qty: 90 | Fill #1

## 2017-11-09 MED FILL — AMLODIPINE BESYLATE 5 MG TA: 5 | 90 days supply | Qty: 90 | Fill #3

## 2017-11-09 MED FILL — WARFARIN SODIUM 5 MG TABLET: 5 | 90 days supply | Qty: 102 | Fill #1

## 2017-11-09 MED FILL — ROSUVASTATIN CALCIUM 20 MG: 20 | 90 days supply | Qty: 90 | Fill #3

## 2017-11-09 MED FILL — VIT D2 1.25 MG (50,000 UNIT: 1.25 MG | 84 days supply | Qty: 24 | Fill #3

## 2017-11-09 MED FILL — LISINOPRIL 20 MG TABS: 20 | 90 days supply | Qty: 180 | Fill #3

## 2017-11-09 MED FILL — OMEPRAZOLE 20 MG CAP: 20 | 90 days supply | Qty: 180 | Fill #1

## 2017-11-17 MED FILL — TADALAFIL 5 MG TABS: 5 | 90 days supply | Qty: 90 | Fill #3

## 2017-11-26 MED FILL — TRIAMCINOLONE 0.1% CREAM: 0.1 | 30 days supply | Qty: 454 | Fill #0

## 2017-11-27 DIAGNOSIS — H401133 Primary open-angle glaucoma, bilateral, severe stage: Secondary | ICD-10-CM | POA: Diagnosis not present

## 2017-12-16 MED FILL — VENLAFAXINE HCL ER 150 MG C: 150 | 90 days supply | Qty: 90 | Fill #3

## 2017-12-16 MED FILL — IBANDRONATE NA 150 MG TAB: 150 | 84 days supply | Qty: 3 | Fill #3

## 2017-12-16 MED FILL — traZODone HCL 50 MG TABS: 50 | 90 days supply | Qty: 90 | Fill #3

## 2017-12-16 MED FILL — ALPHAGAN P 0.1% DROPS: 0.1 | 30 days supply | Qty: 10 | Fill #1

## 2017-12-16 MED FILL — TIMOLOL 0.5% EYE DROPS: 0.5 | 75 days supply | Qty: 15 | Fill #1

## 2017-12-18 MED FILL — TRAVATAN Z 0.004% EYE DROP: 0.004 | 60 days supply | Qty: 5 | Fill #1

## 2017-12-23 DIAGNOSIS — I82509 Chronic embolism and thrombosis of unspecified deep veins of unspecified lower extremity: Secondary | ICD-10-CM | POA: Diagnosis not present

## 2017-12-23 DIAGNOSIS — Z7901 Long term (current) use of anticoagulants: Secondary | ICD-10-CM | POA: Diagnosis not present

## 2018-01-28 DIAGNOSIS — I82509 Chronic embolism and thrombosis of unspecified deep veins of unspecified lower extremity: Secondary | ICD-10-CM | POA: Diagnosis not present

## 2018-01-28 DIAGNOSIS — Z7901 Long term (current) use of anticoagulants: Secondary | ICD-10-CM | POA: Diagnosis not present

## 2018-02-11 DIAGNOSIS — I82509 Chronic embolism and thrombosis of unspecified deep veins of unspecified lower extremity: Secondary | ICD-10-CM | POA: Diagnosis not present

## 2018-02-11 DIAGNOSIS — Z7901 Long term (current) use of anticoagulants: Secondary | ICD-10-CM | POA: Diagnosis not present

## 2018-02-12 MED FILL — TADALAFIL 5 MG TABS: 5 | 90 days supply | Qty: 90 | Fill #4

## 2018-02-12 MED FILL — OMEPRAZOLE 20 MG CAP: 20 | 90 days supply | Qty: 180 | Fill #2

## 2018-02-12 MED FILL — WARFARIN SODIUM 5 MG TABLET: 5 | 90 days supply | Qty: 102 | Fill #2

## 2018-02-17 DIAGNOSIS — L578 Other skin changes due to chronic exposure to nonionizing radiation: Secondary | ICD-10-CM | POA: Diagnosis not present

## 2018-02-17 DIAGNOSIS — L820 Inflamed seborrheic keratosis: Secondary | ICD-10-CM | POA: Diagnosis not present

## 2018-02-17 DIAGNOSIS — L84 Corns and callosities: Secondary | ICD-10-CM | POA: Diagnosis not present

## 2018-02-22 MED FILL — VIT D2 1.25 MG (50,000 UNIT: 1.25 MG | 84 days supply | Qty: 24 | Fill #0

## 2018-02-22 MED FILL — clomiPRAMINE HCL 75 MG CAPS: 75 | 90 days supply | Qty: 90 | Fill #0

## 2018-02-22 MED FILL — LISINOPRIL 20 MG TABLET: 20 | 90 days supply | Qty: 180 | Fill #0

## 2018-02-22 MED FILL — AMLODIPINE BESYLATE 5 MG TA: 5 | 90 days supply | Qty: 90 | Fill #0

## 2018-02-22 MED FILL — ROSUVASTATIN CALCIUM 20 MG: 20 | 90 days supply | Qty: 90 | Fill #0

## 2018-03-02 DIAGNOSIS — M25572 Pain in left ankle and joints of left foot: Secondary | ICD-10-CM | POA: Diagnosis not present

## 2018-03-09 MED FILL — traZODone HCL 50 MG TABS: 50 | 90 days supply | Qty: 90 | Fill #0

## 2018-03-09 MED FILL — ALPHAGAN P 0.1% DROPS: 0.1 | 30 days supply | Qty: 10 | Fill #2

## 2018-03-09 MED FILL — VENLAFAXINE HCL ER 150 MG C: 150 | 90 days supply | Qty: 90 | Fill #0

## 2018-03-09 MED FILL — IBANDRONATE NA 150 MG TAB: 150 | 84 days supply | Qty: 3 | Fill #0

## 2018-03-10 DIAGNOSIS — M810 Age-related osteoporosis without current pathological fracture: Secondary | ICD-10-CM | POA: Diagnosis not present

## 2018-03-10 DIAGNOSIS — E119 Type 2 diabetes mellitus without complications: Secondary | ICD-10-CM | POA: Diagnosis not present

## 2018-03-10 DIAGNOSIS — R82998 Other abnormal findings in urine: Secondary | ICD-10-CM | POA: Diagnosis not present

## 2018-03-10 DIAGNOSIS — Z0000 Encounter for general adult medical examination without abnormal findings: Secondary | ICD-10-CM | POA: Diagnosis not present

## 2018-03-10 DIAGNOSIS — Z125 Encounter for screening for malignant neoplasm of prostate: Secondary | ICD-10-CM | POA: Diagnosis not present

## 2018-03-15 DIAGNOSIS — Z6829 Body mass index (BMI) 29.0-29.9, adult: Secondary | ICD-10-CM | POA: Diagnosis not present

## 2018-03-15 DIAGNOSIS — E663 Overweight: Secondary | ICD-10-CM | POA: Diagnosis not present

## 2018-03-15 DIAGNOSIS — M810 Age-related osteoporosis without current pathological fracture: Secondary | ICD-10-CM | POA: Diagnosis not present

## 2018-03-15 DIAGNOSIS — N528 Other male erectile dysfunction: Secondary | ICD-10-CM | POA: Diagnosis not present

## 2018-03-15 DIAGNOSIS — R42 Dizziness and giddiness: Secondary | ICD-10-CM | POA: Diagnosis not present

## 2018-03-15 DIAGNOSIS — Z1389 Encounter for screening for other disorder: Secondary | ICD-10-CM | POA: Diagnosis not present

## 2018-03-15 DIAGNOSIS — G459 Transient cerebral ischemic attack, unspecified: Secondary | ICD-10-CM | POA: Diagnosis not present

## 2018-03-15 DIAGNOSIS — M25569 Pain in unspecified knee: Secondary | ICD-10-CM | POA: Diagnosis not present

## 2018-03-15 DIAGNOSIS — R2689 Other abnormalities of gait and mobility: Secondary | ICD-10-CM | POA: Diagnosis not present

## 2018-03-15 DIAGNOSIS — E119 Type 2 diabetes mellitus without complications: Secondary | ICD-10-CM | POA: Diagnosis not present

## 2018-03-15 DIAGNOSIS — Z0000 Encounter for general adult medical examination without abnormal findings: Secondary | ICD-10-CM | POA: Diagnosis not present

## 2018-03-15 DIAGNOSIS — I82509 Chronic embolism and thrombosis of unspecified deep veins of unspecified lower extremity: Secondary | ICD-10-CM | POA: Diagnosis not present

## 2018-03-16 DIAGNOSIS — M25572 Pain in left ankle and joints of left foot: Secondary | ICD-10-CM | POA: Diagnosis not present

## 2018-03-16 DIAGNOSIS — M79642 Pain in left hand: Secondary | ICD-10-CM | POA: Diagnosis not present

## 2018-03-17 DIAGNOSIS — Z7901 Long term (current) use of anticoagulants: Secondary | ICD-10-CM | POA: Diagnosis not present

## 2018-03-22 DIAGNOSIS — Z7901 Long term (current) use of anticoagulants: Secondary | ICD-10-CM | POA: Diagnosis not present

## 2018-03-30 DIAGNOSIS — Z7901 Long term (current) use of anticoagulants: Secondary | ICD-10-CM | POA: Diagnosis not present

## 2018-04-07 MED FILL — ZOLPIDEM TARTRATE 10 MG TAB: 10 | 60 days supply | Qty: 60 | Fill #0

## 2018-04-07 MED FILL — TEMAZEPAM 30 MG CAPSULE: 30 | 30 days supply | Qty: 30 | Fill #0

## 2018-04-19 ENCOUNTER — Ambulatory Visit: Payer: Self-pay | Admitting: Podiatry

## 2018-04-20 DIAGNOSIS — H401133 Primary open-angle glaucoma, bilateral, severe stage: Secondary | ICD-10-CM | POA: Diagnosis not present

## 2018-04-20 DIAGNOSIS — Z7901 Long term (current) use of anticoagulants: Secondary | ICD-10-CM | POA: Diagnosis not present

## 2018-04-26 DIAGNOSIS — Z1212 Encounter for screening for malignant neoplasm of rectum: Secondary | ICD-10-CM | POA: Diagnosis not present

## 2018-04-26 DIAGNOSIS — Z1211 Encounter for screening for malignant neoplasm of colon: Secondary | ICD-10-CM | POA: Diagnosis not present

## 2018-04-26 MED FILL — TIMOLOL 0.5% EYE DROPS: 0.5 | 75 days supply | Qty: 15 | Fill #2

## 2018-04-26 MED FILL — ALPHAGAN P 0.1% DROPS: 0.1 | 30 days supply | Qty: 10 | Fill #3

## 2018-04-26 MED FILL — TRAVATAN Z 0.004% EYE DROP: 0.004 | 60 days supply | Qty: 5 | Fill #2

## 2018-04-27 ENCOUNTER — Other Ambulatory Visit: Payer: Self-pay

## 2018-04-28 ENCOUNTER — Ambulatory Visit: Payer: Self-pay | Admitting: Podiatry

## 2018-05-05 ENCOUNTER — Ambulatory Visit: Payer: Self-pay | Admitting: Podiatry

## 2018-05-07 MED FILL — METHOCARBAMOL 500 MG TABS: 500 | 20 days supply | Qty: 60 | Fill #0

## 2018-05-12 DIAGNOSIS — L570 Actinic keratosis: Secondary | ICD-10-CM | POA: Diagnosis not present

## 2018-05-12 DIAGNOSIS — X32XXXD Exposure to sunlight, subsequent encounter: Secondary | ICD-10-CM | POA: Diagnosis not present

## 2018-05-12 DIAGNOSIS — L820 Inflamed seborrheic keratosis: Secondary | ICD-10-CM | POA: Diagnosis not present

## 2018-05-12 DIAGNOSIS — D485 Neoplasm of uncertain behavior of skin: Secondary | ICD-10-CM | POA: Diagnosis not present

## 2018-05-17 MED FILL — VIT D2 1.25 MG (50,000 UNIT: 1.25 MG | 84 days supply | Qty: 24 | Fill #1

## 2018-05-17 MED FILL — ROSUVASTATIN CALCIUM 20 MG: 20 | 90 days supply | Qty: 90 | Fill #1

## 2018-05-17 MED FILL — OMEPRAZOLE 20 MG CAP: 20 | 90 days supply | Qty: 180 | Fill #3

## 2018-05-17 MED FILL — LISINOPRIL 20 MG TABLET: 20 | 90 days supply | Qty: 180 | Fill #1

## 2018-05-17 MED FILL — clomiPRAMINE HCL 75 MG CAPS: 75 | 90 days supply | Qty: 90 | Fill #1

## 2018-05-17 MED FILL — AMLODIPINE BESYLATE 5 MG TA: 5 | 90 days supply | Qty: 90 | Fill #1

## 2018-05-17 MED FILL — WARFARIN SODIUM 5 MG TABLET: 5 | 90 days supply | Qty: 102 | Fill #3

## 2018-05-20 MED FILL — TADALAFIL 5 MG TABS: 5 | 90 days supply | Qty: 90 | Fill #5

## 2018-05-21 DIAGNOSIS — Z7901 Long term (current) use of anticoagulants: Secondary | ICD-10-CM | POA: Diagnosis not present

## 2018-06-01 MED FILL — VENLAFAXINE HCL ER 150 MG C: 150 | 90 days supply | Qty: 90 | Fill #1

## 2018-06-01 MED FILL — ERYTHROMYCIN 2% SOLUTION: 2 | 30 days supply | Qty: 60 | Fill #0

## 2018-06-01 MED FILL — traZODone HCL 50 MG TABS: 50 | 90 days supply | Qty: 90 | Fill #1

## 2018-06-02 ENCOUNTER — Ambulatory Visit (INDEPENDENT_AMBULATORY_CARE_PROVIDER_SITE_OTHER): Payer: Medicare Other | Admitting: Podiatry

## 2018-06-02 ENCOUNTER — Encounter: Payer: Self-pay | Admitting: Podiatry

## 2018-06-02 DIAGNOSIS — L603 Nail dystrophy: Secondary | ICD-10-CM

## 2018-06-02 NOTE — Progress Notes (Signed)
   Subjective:    Patient ID: Brian Owens, male    DOB: 1941-10-05, 77 y.o.   MRN: [id number]  HPI    Review of Systems  All other systems reviewed and are negative.      Objective:   Physical Exam        Assessment & Plan:

## 2018-06-08 NOTE — Progress Notes (Signed)
   Subjective: 77 year old male with PMHx of DM presenting today as a new patient with a chief complaint of discolored, thickened and painful toenails of the right foot. He has been using OTC products for treatment with no significant relief. He denies modifying factors. Patient presents today for further treatment and evaluation.  Past Medical History:  Diagnosis Date  . Anxiety   . Arthritis   . BPH (benign prostatic hyperplasia)   . Cancer ([place])    removed  . Depression   . Diabetes mellitus without complication (HCC)    no meds  . DVT (deep venous thrombosis) ([place])   . GERD (gastroesophageal reflux disease)   . Glaucoma   . Hypertension     Objective: Physical Exam General: The patient is alert and oriented x3 in no acute distress.  Dermatology: Hyperkeratotic, discolored, thickened, onychodystrophy of nails 1-5 of the right foot. Skin is warm, dry and supple bilateral lower extremities. Negative for open lesions or macerations.  Vascular: Palpable pedal pulses bilaterally. No edema or erythema noted. Capillary refill within normal limits.  Neurological: Epicritic and protective threshold grossly intact bilaterally.   Musculoskeletal Exam: Range of motion within normal limits to all pedal and ankle joints bilateral. Muscle strength 5/5 in all groups bilateral.   Assessment: #1 dystrophic nails 1-5 right   Plan of Care:  #1 Patient was evaluated. #2 Explained that the nails are likely irreversible at this point.  #3 Recommended good foot hygiene.  #4 Return to clinic as needed.    Edrick Kins, DPM [facility]  Dr. Edrick Kins, [place]                                        [place], [place] [address]                Office [phone]  Fax [phone]

## 2018-06-14 MED FILL — ALPHAGAN P 0.1% DROPS: 0.1 | 30 days supply | Qty: 10 | Fill #4

## 2018-06-14 MED FILL — IBANDRONATE NA 150 MG TAB: 150 | 84 days supply | Qty: 3 | Fill #1

## 2018-06-16 DIAGNOSIS — M542 Cervicalgia: Secondary | ICD-10-CM | POA: Diagnosis not present

## 2018-06-16 DIAGNOSIS — Z6827 Body mass index (BMI) 27.0-27.9, adult: Secondary | ICD-10-CM | POA: Diagnosis not present

## 2018-06-16 DIAGNOSIS — I10 Essential (primary) hypertension: Secondary | ICD-10-CM | POA: Diagnosis not present

## 2018-06-16 DIAGNOSIS — G5601 Carpal tunnel syndrome, right upper limb: Secondary | ICD-10-CM | POA: Diagnosis not present

## 2018-06-16 DIAGNOSIS — G5621 Lesion of ulnar nerve, right upper limb: Secondary | ICD-10-CM | POA: Diagnosis not present

## 2018-06-23 DIAGNOSIS — L820 Inflamed seborrheic keratosis: Secondary | ICD-10-CM | POA: Diagnosis not present

## 2018-06-23 DIAGNOSIS — X32XXXD Exposure to sunlight, subsequent encounter: Secondary | ICD-10-CM | POA: Diagnosis not present

## 2018-06-23 DIAGNOSIS — L570 Actinic keratosis: Secondary | ICD-10-CM | POA: Diagnosis not present

## 2018-06-25 DIAGNOSIS — M79601 Pain in right arm: Secondary | ICD-10-CM | POA: Diagnosis not present

## 2018-06-30 DIAGNOSIS — M79601 Pain in right arm: Secondary | ICD-10-CM | POA: Diagnosis not present

## 2018-07-07 DIAGNOSIS — M79601 Pain in right arm: Secondary | ICD-10-CM | POA: Diagnosis not present

## 2018-07-14 DIAGNOSIS — M79601 Pain in right arm: Secondary | ICD-10-CM | POA: Diagnosis not present

## 2018-07-19 DIAGNOSIS — M810 Age-related osteoporosis without current pathological fracture: Secondary | ICD-10-CM | POA: Diagnosis not present

## 2018-07-19 DIAGNOSIS — Z7901 Long term (current) use of anticoagulants: Secondary | ICD-10-CM | POA: Diagnosis not present

## 2018-07-22 DIAGNOSIS — H401133 Primary open-angle glaucoma, bilateral, severe stage: Secondary | ICD-10-CM | POA: Diagnosis not present

## 2018-07-22 DIAGNOSIS — H2513 Age-related nuclear cataract, bilateral: Secondary | ICD-10-CM | POA: Diagnosis not present

## 2018-07-28 DIAGNOSIS — M79601 Pain in right arm: Secondary | ICD-10-CM | POA: Diagnosis not present

## 2018-08-04 DIAGNOSIS — M79601 Pain in right arm: Secondary | ICD-10-CM | POA: Diagnosis not present

## 2018-08-10 MED FILL — VIT D2 1.25 MG (50,000 UNIT: 1.25 MG | 84 days supply | Qty: 24 | Fill #2

## 2018-08-10 MED FILL — ALPHAGAN P 0.1% DROPS: 0.1 | 30 days supply | Qty: 10 | Fill #5

## 2018-08-10 MED FILL — LISINOPRIL 20 MG TABLET: 20 | 90 days supply | Qty: 180 | Fill #2

## 2018-08-10 MED FILL — WARFARIN SODIUM 5 MG TABLET: 5 | 90 days supply | Qty: 102 | Fill #0

## 2018-08-10 MED FILL — ROSUVASTATIN CALCIUM 20 MG: 20 | 90 days supply | Qty: 90 | Fill #2

## 2018-08-10 MED FILL — OMEPRAZOLE 20 MG CAP: 20 | 90 days supply | Qty: 180 | Fill #0

## 2018-08-10 MED FILL — ZOLPIDEM TARTRATE 10 MG TAB: 10 | 60 days supply | Qty: 60 | Fill #1

## 2018-08-10 MED FILL — TRAVATAN Z 0.004% EYE DROP: 0.004 | 60 days supply | Qty: 5 | Fill #3

## 2018-08-10 MED FILL — clomiPRAMINE HCL 75 MG CAPS: 75 | 90 days supply | Qty: 90 | Fill #0

## 2018-08-10 MED FILL — TADALAFIL 5 MG TABS: 5 | 30 days supply | Qty: 30 | Fill #0

## 2018-08-10 MED FILL — TIMOLOL 0.5% EYE DROPS: 0.5 | 75 days supply | Qty: 15 | Fill #3

## 2018-08-10 MED FILL — AMLODIPINE BESYLATE 5 MG TA: 5 | 90 days supply | Qty: 90 | Fill #2

## 2018-08-11 DIAGNOSIS — M79601 Pain in right arm: Secondary | ICD-10-CM | POA: Diagnosis not present

## 2018-08-26 MED FILL — NAPROXEN 500 MG TABLET: 500 | 5 days supply | Qty: 12 | Fill #0

## 2018-08-26 MED FILL — AMOX-CLAV 875-125 MG TABLET: 875-125 | 7 days supply | Qty: 14 | Fill #0

## 2018-08-30 MED FILL — VENLAFAXINE HCL ER 150 MG C: 150 | 90 days supply | Qty: 90 | Fill #2

## 2018-08-30 MED FILL — ALPRAZolam 0.5 MG TABS: 0.5 | 30 days supply | Qty: 60 | Fill #0

## 2018-08-30 MED FILL — traZODone HCL 50 MG TABS: 50 | 90 days supply | Qty: 90 | Fill #2

## 2018-09-13 DIAGNOSIS — M810 Age-related osteoporosis without current pathological fracture: Secondary | ICD-10-CM | POA: Diagnosis not present

## 2018-09-13 DIAGNOSIS — I82509 Chronic embolism and thrombosis of unspecified deep veins of unspecified lower extremity: Secondary | ICD-10-CM | POA: Diagnosis not present

## 2018-09-13 DIAGNOSIS — Z6828 Body mass index (BMI) 28.0-28.9, adult: Secondary | ICD-10-CM | POA: Diagnosis not present

## 2018-09-13 DIAGNOSIS — I10 Essential (primary) hypertension: Secondary | ICD-10-CM | POA: Diagnosis not present

## 2018-09-13 DIAGNOSIS — Z23 Encounter for immunization: Secondary | ICD-10-CM | POA: Diagnosis not present

## 2018-09-13 DIAGNOSIS — E119 Type 2 diabetes mellitus without complications: Secondary | ICD-10-CM | POA: Diagnosis not present

## 2018-09-13 MED FILL — AMOXICILLIN 500 MG CAPSULE: 500 | 3 days supply | Qty: 12 | Fill #0

## 2018-09-13 MED FILL — NAFTIN 2 % GEL: 2 | 10 days supply | Qty: 45 | Fill #0

## 2018-09-13 MED FILL — TADALAFIL 5 MG TABS: 5 | 90 days supply | Qty: 90 | Fill #1

## 2018-09-14 MED FILL — IBANDRONATE NA 150 MG TAB: 150 | 84 days supply | Qty: 3 | Fill #2

## 2018-10-18 MED FILL — ALPHAGAN P 0.1% DROPS: 0.1 | 90 days supply | Qty: 30 | Fill #0

## 2018-10-21 DIAGNOSIS — Z7901 Long term (current) use of anticoagulants: Secondary | ICD-10-CM | POA: Diagnosis not present

## 2018-11-08 MED FILL — ALPRAZolam 0.5 MG TABS: 0.5 | 30 days supply | Qty: 60 | Fill #1

## 2018-11-08 MED FILL — ROSUVASTATIN CALCIUM 20 MG: 20 | 90 days supply | Qty: 90 | Fill #3

## 2018-11-08 MED FILL — LISINOPRIL 20 MG TABLET: 20 | 90 days supply | Qty: 180 | Fill #3

## 2018-11-08 MED FILL — VIT D2 1.25 MG (50,000 UNIT: 1.25 MG | 84 days supply | Qty: 24 | Fill #3

## 2018-11-08 MED FILL — clomiPRAMINE HCL 75 MG CAPS: 75 | 90 days supply | Qty: 90 | Fill #1

## 2018-11-08 MED FILL — AMLODIPINE BESYLATE 5 MG TA: 5 | 90 days supply | Qty: 90 | Fill #3

## 2018-11-08 MED FILL — OMEPRAZOLE 20 MG CPDR: 20 | 90 days supply | Qty: 180 | Fill #1

## 2018-11-15 DIAGNOSIS — H401133 Primary open-angle glaucoma, bilateral, severe stage: Secondary | ICD-10-CM | POA: Diagnosis not present

## 2018-11-15 DIAGNOSIS — H5203 Hypermetropia, bilateral: Secondary | ICD-10-CM | POA: Diagnosis not present

## 2018-11-15 MED FILL — TIMOLOL 0.5% EYE DROPS: 0.5 | 75 days supply | Qty: 15 | Fill #0

## 2018-11-15 MED FILL — TRAVATAN Z 0.004% EYE DROP: 0.004 | 75 days supply | Qty: 8 | Fill #0

## 2018-11-29 MED FILL — traZODone HCL 50 MG TABS: 50 | 90 days supply | Qty: 90 | Fill #3

## 2018-11-29 MED FILL — TIMOLOL 0.5% EYE DROPS: 0.5 | 75 days supply | Qty: 15 | Fill #0

## 2018-11-29 MED FILL — VENLAFAXINE HCL ER 150 MG C: 150 | 90 days supply | Qty: 90 | Fill #3

## 2018-11-29 MED FILL — TADALAFIL 5 MG TABS: 5 | 90 days supply | Qty: 90 | Fill #2

## 2018-11-30 DIAGNOSIS — Z7901 Long term (current) use of anticoagulants: Secondary | ICD-10-CM | POA: Diagnosis not present

## 2018-12-17 MED FILL — AMOXICILLIN 500 MG CAPSULE: 500 | 3 days supply | Qty: 12 | Fill #1

## 2018-12-17 MED FILL — IBANDRONATE NA 150 MG TAB: 150 | 84 days supply | Qty: 3 | Fill #3

## 2018-12-24 DIAGNOSIS — Z7901 Long term (current) use of anticoagulants: Secondary | ICD-10-CM | POA: Diagnosis not present

## 2018-12-27 MED FILL — WARFARIN SODIUM 5 MG TABLET: 5 | 90 days supply | Qty: 102 | Fill #1

## 2019-01-28 MED FILL — FLURAZEPAM 30 MG CAPSULE: 30 | 30 days supply | Qty: 30 | Fill #0

## 2019-01-31 MED FILL — VIT D2 1.25 MG (50,000 UNIT: 1.25 MG | 84 days supply | Qty: 24 | Fill #4

## 2019-01-31 MED FILL — ALPHAGAN P 0.1% DROPS: 0.1 | 90 days supply | Qty: 30 | Fill #1

## 2019-02-01 DIAGNOSIS — Z7901 Long term (current) use of anticoagulants: Secondary | ICD-10-CM | POA: Diagnosis not present

## 2019-02-07 DIAGNOSIS — L218 Other seborrheic dermatitis: Secondary | ICD-10-CM | POA: Diagnosis not present

## 2019-02-07 DIAGNOSIS — L84 Corns and callosities: Secondary | ICD-10-CM | POA: Diagnosis not present

## 2019-02-07 DIAGNOSIS — L820 Inflamed seborrheic keratosis: Secondary | ICD-10-CM | POA: Diagnosis not present

## 2019-02-07 MED FILL — OMEPRAZOLE 20 MG CPDR: 20 | 90 days supply | Qty: 180 | Fill #2 | Status: TO

## 2019-02-07 MED FILL — clomiPRAMINE HCL 75 MG CAPS: 75 | 90 days supply | Qty: 90 | Fill #2 | Status: TO

## 2019-02-07 MED FILL — LISINOPRIL 20 MG TABLET: 20 | 90 days supply | Qty: 180 | Fill #4 | Status: TO

## 2019-02-07 MED FILL — ROSUVASTATIN CALCIUM 20 MG: 20 | 90 days supply | Qty: 90 | Fill #4 | Status: TO

## 2019-02-08 MED FILL — FLUTICASONE PROP 0.05% CRM: 0.05 | 20 days supply | Qty: 60 | Fill #0

## 2019-02-16 MED FILL — AMLODIPINE BESYLATE 5 MG TA: 5 | 90 days supply | Qty: 90 | Fill #4

## 2019-02-25 DIAGNOSIS — H401133 Primary open-angle glaucoma, bilateral, severe stage: Secondary | ICD-10-CM | POA: Diagnosis not present

## 2019-02-28 MED FILL — TADALAFIL 5 MG TABS: 5 | 90 days supply | Qty: 90 | Fill #3

## 2019-02-28 MED FILL — traZODone HCL 50 MG TABS: 50 | 90 days supply | Qty: 90 | Fill #0

## 2019-02-28 MED FILL — VENLAFAXINE HCL ER 150 MG C: 150 | 90 days supply | Qty: 90 | Fill #0

## 2019-03-02 DIAGNOSIS — Z7901 Long term (current) use of anticoagulants: Secondary | ICD-10-CM | POA: Diagnosis not present

## 2019-04-07 MED FILL — IBANDRONATE SODIUM 150 MG T: 150 | 84 days supply | Qty: 3 | Fill #0

## 2019-04-21 DIAGNOSIS — I10 Essential (primary) hypertension: Secondary | ICD-10-CM | POA: Diagnosis not present

## 2019-04-21 DIAGNOSIS — M810 Age-related osteoporosis without current pathological fracture: Secondary | ICD-10-CM | POA: Diagnosis not present

## 2019-04-21 DIAGNOSIS — E119 Type 2 diabetes mellitus without complications: Secondary | ICD-10-CM | POA: Diagnosis not present

## 2019-04-21 DIAGNOSIS — Z7901 Long term (current) use of anticoagulants: Secondary | ICD-10-CM | POA: Diagnosis not present

## 2019-04-21 DIAGNOSIS — E7849 Other hyperlipidemia: Secondary | ICD-10-CM | POA: Diagnosis not present

## 2019-04-21 DIAGNOSIS — Z125 Encounter for screening for malignant neoplasm of prostate: Secondary | ICD-10-CM | POA: Diagnosis not present

## 2019-04-22 DIAGNOSIS — R82998 Other abnormal findings in urine: Secondary | ICD-10-CM | POA: Diagnosis not present

## 2019-04-22 DIAGNOSIS — E119 Type 2 diabetes mellitus without complications: Secondary | ICD-10-CM | POA: Diagnosis not present

## 2019-04-22 MED FILL — LISINOPRIL 20 MG TABLET: 20 | 90 days supply | Qty: 180 | Fill #0

## 2019-04-22 MED FILL — clomiPRAMINE HCL 75 MG CAPS: 75 | 90 days supply | Qty: 90 | Fill #0

## 2019-04-22 MED FILL — WARFARIN SODIUM 5 MG TABLET: 5 | 90 days supply | Qty: 102 | Fill #0

## 2019-04-22 MED FILL — TIMOLOL 0.5% EYE DROPS: 0.5 | 75 days supply | Qty: 15 | Fill #0

## 2019-04-22 MED FILL — ALPHAGAN P 0.1% DROPS: 0.1 | 90 days supply | Qty: 30 | Fill #0

## 2019-04-22 MED FILL — OMEPRAZOLE 20 MG CPDR: 20 | 90 days supply | Qty: 180 | Fill #0

## 2019-04-22 MED FILL — ROSUVASTATIN CALCIUM 20 MG: 20 | 90 days supply | Qty: 90 | Fill #0

## 2019-04-25 MED FILL — VIT D2 1.25 MG (50,000 UNIT: 1.25 MG | 84 days supply | Qty: 24 | Fill #0

## 2019-04-28 DIAGNOSIS — Z1339 Encounter for screening examination for other mental health and behavioral disorders: Secondary | ICD-10-CM | POA: Diagnosis not present

## 2019-04-28 DIAGNOSIS — G459 Transient cerebral ischemic attack, unspecified: Secondary | ICD-10-CM | POA: Diagnosis not present

## 2019-04-28 DIAGNOSIS — I82509 Chronic embolism and thrombosis of unspecified deep veins of unspecified lower extremity: Secondary | ICD-10-CM | POA: Diagnosis not present

## 2019-04-28 DIAGNOSIS — N529 Male erectile dysfunction, unspecified: Secondary | ICD-10-CM | POA: Diagnosis not present

## 2019-04-28 DIAGNOSIS — R42 Dizziness and giddiness: Secondary | ICD-10-CM | POA: Diagnosis not present

## 2019-04-28 DIAGNOSIS — E663 Overweight: Secondary | ICD-10-CM | POA: Diagnosis not present

## 2019-04-28 DIAGNOSIS — B351 Tinea unguium: Secondary | ICD-10-CM | POA: Diagnosis not present

## 2019-04-28 DIAGNOSIS — E785 Hyperlipidemia, unspecified: Secondary | ICD-10-CM | POA: Diagnosis not present

## 2019-04-28 DIAGNOSIS — R2689 Other abnormalities of gait and mobility: Secondary | ICD-10-CM | POA: Diagnosis not present

## 2019-04-28 DIAGNOSIS — M810 Age-related osteoporosis without current pathological fracture: Secondary | ICD-10-CM | POA: Diagnosis not present

## 2019-04-28 DIAGNOSIS — E119 Type 2 diabetes mellitus without complications: Secondary | ICD-10-CM | POA: Diagnosis not present

## 2019-04-28 DIAGNOSIS — Z1331 Encounter for screening for depression: Secondary | ICD-10-CM | POA: Diagnosis not present

## 2019-04-28 DIAGNOSIS — Z0000 Encounter for general adult medical examination without abnormal findings: Secondary | ICD-10-CM | POA: Diagnosis not present

## 2019-04-28 DIAGNOSIS — I10 Essential (primary) hypertension: Secondary | ICD-10-CM | POA: Diagnosis not present

## 2019-04-28 MED FILL — FREESTYLE LITE METER: 30 days supply | Qty: 1 | Fill #0

## 2019-04-28 MED FILL — FREESTYLE LANCETS: 90 days supply | Qty: 100 | Fill #0

## 2019-04-28 MED FILL — FREESTYLE LITE TEST STRIP: 90 days supply | Qty: 100 | Fill #0

## 2019-05-19 MED FILL — VENLAFAXINE HCL ER 150 MG C: 150 | 90 days supply | Qty: 90 | Fill #1

## 2019-05-19 MED FILL — AMLODIPINE BESYLATE 5 MG TA: 5 | 90 days supply | Qty: 90 | Fill #0

## 2019-05-19 MED FILL — traZODone HCL 50 MG TABS: 50 | 90 days supply | Qty: 90 | Fill #1

## 2019-05-31 DIAGNOSIS — I82509 Chronic embolism and thrombosis of unspecified deep veins of unspecified lower extremity: Secondary | ICD-10-CM | POA: Diagnosis not present

## 2019-05-31 DIAGNOSIS — R413 Other amnesia: Secondary | ICD-10-CM | POA: Diagnosis not present

## 2019-05-31 DIAGNOSIS — Z7901 Long term (current) use of anticoagulants: Secondary | ICD-10-CM | POA: Diagnosis not present

## 2019-06-08 MED FILL — TADALAFIL 5 MG TABS: 5 | 60 days supply | Qty: 60 | Fill #4

## 2019-06-10 MED FILL — AMOXICILLIN 500 MG CAPSULE: 500 | 3 days supply | Qty: 12 | Fill #2

## 2019-06-14 DIAGNOSIS — H401113 Primary open-angle glaucoma, right eye, severe stage: Secondary | ICD-10-CM | POA: Diagnosis not present

## 2019-06-27 DIAGNOSIS — R040 Epistaxis: Secondary | ICD-10-CM | POA: Diagnosis not present

## 2019-06-27 DIAGNOSIS — Z7901 Long term (current) use of anticoagulants: Secondary | ICD-10-CM | POA: Diagnosis not present

## 2019-07-05 MED FILL — IBANDRONATE NA 150 MG TAB: 150 | 84 days supply | Qty: 3 | Fill #1

## 2019-07-19 MED FILL — VIT D2 1.25 MG (50,000 UNIT: 1.25 MG | 84 days supply | Qty: 24 | Fill #1

## 2019-07-19 MED FILL — LISINOPRIL 20 MG TABLET: 20 | 90 days supply | Qty: 180 | Fill #1

## 2019-07-19 MED FILL — clomiPRAMINE HCL 75 MG CAPS: 75 | 90 days supply | Qty: 90 | Fill #0

## 2019-07-19 MED FILL — OMEPRAZOLE 20 MG CAP: 20 | 90 days supply | Qty: 180 | Fill #0

## 2019-07-19 MED FILL — ROSUVASTATIN CALCIUM 20 MG: 20 | 90 days supply | Qty: 90 | Fill #1

## 2019-07-19 MED FILL — ALPRAZolam 0.5 MG TABS: 0.5 | 30 days supply | Qty: 60 | Fill #0

## 2019-08-05 DIAGNOSIS — Z7901 Long term (current) use of anticoagulants: Secondary | ICD-10-CM | POA: Diagnosis not present

## 2019-08-05 DIAGNOSIS — Z23 Encounter for immunization: Secondary | ICD-10-CM | POA: Diagnosis not present

## 2019-08-10 DIAGNOSIS — L218 Other seborrheic dermatitis: Secondary | ICD-10-CM | POA: Diagnosis not present

## 2019-08-10 DIAGNOSIS — L84 Corns and callosities: Secondary | ICD-10-CM | POA: Diagnosis not present

## 2019-08-10 DIAGNOSIS — L820 Inflamed seborrheic keratosis: Secondary | ICD-10-CM | POA: Diagnosis not present

## 2019-08-10 DIAGNOSIS — L720 Epidermal cyst: Secondary | ICD-10-CM | POA: Diagnosis not present

## 2019-08-11 MED FILL — TRAVOPROST (BAK FREE) 0.004: 0.004 | 75 days supply | Qty: 8 | Fill #0

## 2019-08-11 MED FILL — traZODone HCL 50 MG TABS: 50 | 90 days supply | Qty: 90 | Fill #2

## 2019-08-11 MED FILL — TIMOLOL 0.5% EYE DROPS: 0.5 | 25 days supply | Qty: 5 | Fill #1

## 2019-08-11 MED FILL — AMLODIPINE BESYLATE 5 MG TA: 5 | 90 days supply | Qty: 90 | Fill #1

## 2019-08-11 MED FILL — DOXYCYCLINE HYCLATE 100 MG: 100 | 14 days supply | Qty: 28 | Fill #0

## 2019-08-11 MED FILL — TADALAFIL 5 MG TABS: 5 | 30 days supply | Qty: 30 | Fill #0

## 2019-08-11 MED FILL — ALPHAGAN P 0.1% DROPS: 0.1 | 90 days supply | Qty: 30 | Fill #1

## 2019-09-07 MED FILL — TADALAFIL 5 MG TABS: 5 | 90 days supply | Qty: 90 | Fill #0

## 2019-09-15 DIAGNOSIS — H401133 Primary open-angle glaucoma, bilateral, severe stage: Secondary | ICD-10-CM | POA: Diagnosis not present

## 2019-09-19 MED FILL — FLURAZEPAM 30 MG CAPSULE: 30 | 30 days supply | Qty: 30 | Fill #0

## 2019-09-19 MED FILL — DOXYCYCLINE HYCLATE 100 MG: 100 | 14 days supply | Qty: 28 | Fill #1

## 2019-09-19 MED FILL — VENLAFAXINE HCL ER 150 MG C: 150 | 90 days supply | Qty: 90 | Fill #2

## 2019-09-19 MED FILL — IBANDRONATE NA 150 MG TAB: 150 | 84 days supply | Qty: 3 | Fill #2

## 2019-09-19 MED FILL — ALPRAZolam 0.5 MG TABS: 0.5 | 30 days supply | Qty: 60 | Fill #1

## 2019-09-19 MED FILL — WARFARIN SODIUM 5 MG TABLET: 5 | 90 days supply | Qty: 102 | Fill #0

## 2019-10-10 MED FILL — FREESTYLE LANCETS: 90 days supply | Qty: 100 | Fill #1

## 2019-10-10 MED FILL — LISINOPRIL 20 MG TABLET: 20 | 90 days supply | Qty: 180 | Fill #2

## 2019-10-10 MED FILL — VIT D2 1.25 MG (50,000 UNIT: 1.25 MG | 84 days supply | Qty: 24 | Fill #2

## 2019-10-10 MED FILL — clomiPRAMINE HCL 75 MG CAPS: 75 | 90 days supply | Qty: 90 | Fill #1

## 2019-10-10 MED FILL — ZOLPIDEM TARTRATE 10 MG TAB: 10 | 60 days supply | Qty: 60 | Fill #0

## 2019-10-10 MED FILL — FREESTYLE LITE TEST STRIP: 90 days supply | Qty: 100 | Fill #1

## 2019-10-10 MED FILL — OMEPRAZOLE 20 MG CAP: 20 | 90 days supply | Qty: 180 | Fill #1

## 2019-10-25 DIAGNOSIS — R040 Epistaxis: Secondary | ICD-10-CM | POA: Diagnosis not present

## 2019-10-25 DIAGNOSIS — R2689 Other abnormalities of gait and mobility: Secondary | ICD-10-CM | POA: Diagnosis not present

## 2019-10-25 DIAGNOSIS — G250 Essential tremor: Secondary | ICD-10-CM | POA: Diagnosis not present

## 2019-10-25 DIAGNOSIS — N3281 Overactive bladder: Secondary | ICD-10-CM | POA: Diagnosis not present

## 2019-10-25 DIAGNOSIS — E1169 Type 2 diabetes mellitus with other specified complication: Secondary | ICD-10-CM | POA: Diagnosis not present

## 2019-10-25 DIAGNOSIS — M810 Age-related osteoporosis without current pathological fracture: Secondary | ICD-10-CM | POA: Diagnosis not present

## 2019-10-25 DIAGNOSIS — Z7901 Long term (current) use of anticoagulants: Secondary | ICD-10-CM | POA: Diagnosis not present

## 2019-10-25 DIAGNOSIS — R413 Other amnesia: Secondary | ICD-10-CM | POA: Diagnosis not present

## 2019-10-25 DIAGNOSIS — I82509 Chronic embolism and thrombosis of unspecified deep veins of unspecified lower extremity: Secondary | ICD-10-CM | POA: Diagnosis not present

## 2019-10-25 DIAGNOSIS — K5909 Other constipation: Secondary | ICD-10-CM | POA: Diagnosis not present

## 2019-10-31 MED FILL — ROSUVASTATIN CALCIUM 20 MG: 20 | 90 days supply | Qty: 90 | Fill #2

## 2019-10-31 MED FILL — TIMOLOL 0.5% EYE DROPS: 0.5 | 25 days supply | Qty: 5 | Fill #2

## 2019-11-14 MED FILL — AMLODIPINE BESYLATE 5 MG TA: 5 | 90 days supply | Qty: 90 | Fill #2

## 2019-11-14 MED FILL — traZODone HCL 50 MG TABS: 50 | 90 days supply | Qty: 90 | Fill #3

## 2019-12-01 DIAGNOSIS — Z7901 Long term (current) use of anticoagulants: Secondary | ICD-10-CM | POA: Diagnosis not present

## 2019-12-02 MED FILL — TADALAFIL 5 MG TABS: 5 | 90 days supply | Qty: 90 | Fill #1

## 2019-12-05 MED FILL — TIMOLOL 0.5% EYE DROPS: 0.5 | 75 days supply | Qty: 15 | Fill #0

## 2019-12-19 MED FILL — VENLAFAXINE HCL ER 150 MG C: 150 | 90 days supply | Qty: 90 | Fill #3

## 2019-12-21 MED FILL — TRIAMCINOLONE ACETONIDE 0.1: 0.1 | 30 days supply | Qty: 454 | Fill #0

## 2019-12-26 DIAGNOSIS — M810 Age-related osteoporosis without current pathological fracture: Secondary | ICD-10-CM | POA: Diagnosis not present

## 2019-12-26 DIAGNOSIS — I82509 Chronic embolism and thrombosis of unspecified deep veins of unspecified lower extremity: Secondary | ICD-10-CM | POA: Diagnosis not present

## 2019-12-26 DIAGNOSIS — E785 Hyperlipidemia, unspecified: Secondary | ICD-10-CM | POA: Diagnosis not present

## 2019-12-26 DIAGNOSIS — K219 Gastro-esophageal reflux disease without esophagitis: Secondary | ICD-10-CM | POA: Diagnosis not present

## 2019-12-26 DIAGNOSIS — E1169 Type 2 diabetes mellitus with other specified complication: Secondary | ICD-10-CM | POA: Diagnosis not present

## 2019-12-26 DIAGNOSIS — N3281 Overactive bladder: Secondary | ICD-10-CM | POA: Diagnosis not present

## 2019-12-26 DIAGNOSIS — K5909 Other constipation: Secondary | ICD-10-CM | POA: Diagnosis not present

## 2019-12-26 DIAGNOSIS — R040 Epistaxis: Secondary | ICD-10-CM | POA: Diagnosis not present

## 2019-12-26 DIAGNOSIS — G250 Essential tremor: Secondary | ICD-10-CM | POA: Diagnosis not present

## 2019-12-26 DIAGNOSIS — F4002 Agoraphobia without panic disorder: Secondary | ICD-10-CM | POA: Diagnosis not present

## 2019-12-26 DIAGNOSIS — R413 Other amnesia: Secondary | ICD-10-CM | POA: Diagnosis not present

## 2019-12-26 DIAGNOSIS — R2689 Other abnormalities of gait and mobility: Secondary | ICD-10-CM | POA: Diagnosis not present

## 2020-01-05 MED FILL — AMOXICILLIN 500 MG CAPSULE: 500 | 3 days supply | Qty: 12 | Fill #1

## 2020-01-05 MED FILL — LISINOPRIL 20 MG TABLET: 20 | 90 days supply | Qty: 180 | Fill #3

## 2020-01-05 MED FILL — VIT D2 1.25 MG (50,000 UNIT: 1.25 MG | 84 days supply | Qty: 24 | Fill #3

## 2020-01-05 MED FILL — clomiPRAMINE HCL 75 MG CAPS: 75 | 90 days supply | Qty: 90 | Fill #2

## 2020-01-05 MED FILL — ZOLPIDEM TARTRATE 10 MG TAB: 10 | 60 days supply | Qty: 60 | Fill #1

## 2020-01-05 MED FILL — FLURAZEPAM 30 MG CAPSULE: 30 | 30 days supply | Qty: 30 | Fill #1

## 2020-01-05 MED FILL — OMEPRAZOLE 20 MG CAP: 20 | 90 days supply | Qty: 180 | Fill #2

## 2020-01-06 DIAGNOSIS — H401133 Primary open-angle glaucoma, bilateral, severe stage: Secondary | ICD-10-CM | POA: Diagnosis not present

## 2020-01-06 MED FILL — TRAVOPROST (BAK FREE) 0.004: 0.004 | 25 days supply | Qty: 3 | Fill #0

## 2020-01-06 MED FILL — ALPHAGAN P 0.1% DROPS: 0.1 | 67 days supply | Qty: 20 | Fill #0

## 2020-02-15 DIAGNOSIS — Z23 Encounter for immunization: Secondary | ICD-10-CM | POA: Diagnosis not present

## 2020-03-13 DIAGNOSIS — Z23 Encounter for immunization: Secondary | ICD-10-CM | POA: Diagnosis not present
